# Patient Record
Sex: Female | Born: 1965 | Race: Asian | Hispanic: No | Marital: Married | Smoking: Never smoker
Health system: Southern US, Community
[De-identification: ages and names within clinical notes are randomized; demographics above are authoritative.]

## PROBLEM LIST (undated history)

## (undated) DIAGNOSIS — E785 Hyperlipidemia, unspecified: Secondary | ICD-10-CM

## (undated) DIAGNOSIS — I10 Essential (primary) hypertension: Secondary | ICD-10-CM

## (undated) DIAGNOSIS — D649 Anemia, unspecified: Secondary | ICD-10-CM

## (undated) HISTORY — DX: Essential (primary) hypertension: I10

## (undated) HISTORY — PX: NO PAST SURGERIES: SHX2092

## (undated) HISTORY — DX: Hyperlipidemia, unspecified: E78.5

## (undated) HISTORY — DX: Anemia, unspecified: D64.9

---

## 2006-01-19 ENCOUNTER — Other Ambulatory Visit: Admission: RE | Admit: 2006-01-19 | Discharge: 2006-01-19 | Payer: Self-pay | Admitting: Obstetrics and Gynecology

## 2006-01-25 ENCOUNTER — Ambulatory Visit: Payer: Self-pay | Admitting: Obstetrics and Gynecology

## 2006-03-09 ENCOUNTER — Ambulatory Visit: Payer: Self-pay | Admitting: Obstetrics and Gynecology

## 2006-03-11 ENCOUNTER — Ambulatory Visit (HOSPITAL_COMMUNITY): Admission: RE | Admit: 2006-03-11 | Discharge: 2006-03-11 | Payer: Self-pay | Admitting: Obstetrics and Gynecology

## 2006-06-20 ENCOUNTER — Encounter: Admission: RE | Admit: 2006-06-20 | Discharge: 2006-06-20 | Payer: Self-pay | Admitting: Family Medicine

## 2006-12-20 LAB — HM MAMMOGRAPHY: HM Mammogram: NORMAL

## 2011-02-10 ENCOUNTER — Other Ambulatory Visit (HOSPITAL_COMMUNITY)
Admission: RE | Admit: 2011-02-10 | Discharge: 2011-02-10 | Disposition: A | Discharge status: Home / Self Care | Payer: BC Managed Care – PPO | Source: Ambulatory Visit | Attending: Obstetrics and Gynecology | Admitting: Obstetrics and Gynecology

## 2011-02-10 ENCOUNTER — Other Ambulatory Visit: Payer: Self-pay | Admitting: Obstetrics and Gynecology

## 2011-02-10 DIAGNOSIS — Z01419 Encounter for gynecological examination (general) (routine) without abnormal findings: Secondary | ICD-10-CM | POA: Insufficient documentation

## 2011-03-03 ENCOUNTER — Other Ambulatory Visit: Payer: Self-pay | Admitting: Obstetrics and Gynecology

## 2011-05-07 NOTE — Group Therapy Note (Signed)
Amber [name], ATILANO NO.:  [id number]   MEDICAL RECORD NO.:  [id number]          PATIENT TYPE:  WOC   LOCATION:  [facility]                   FACILITY:  [facility]   PHYSICIAN:  Argentina Donovan, MD        DATE OF BIRTH:  01/04/1966   DATE OF SERVICE:                                    CLINIC NOTE   The patient is a 45 year old Asian female, with severe dysfunctional  bleeding, that was seen 1 month ago.  She started on oral contraceptives,  i.e., Yasmin, and the bleeding has gotten worse since that time.  It has now  abated.  I am encouraging her to restart the pill.  Meanwhile, we will get  and ultrasound to make sure she has nothing anatomical that we can identify.  If she continues to bleed heavily or begins bleeding heavily, we will  schedule a D&C.  If that does not work, we will go to an endometrial  ablation and if that does not work, the hysterectomy.  We have discussed  this with the patient.  She seems to understand this and that we do not want  to be too radical in her treatment, and she will call us if the bleeding  starts up again.           ______________________________  Argentina Donovan, MD     PR/MEDQ  D:  03/09/2006  T:  03/10/2006  Job:  [id number]

## 2011-05-07 NOTE — Group Therapy Note (Signed)
NAME:  Amber [name], Amber [name] NO.:  [id number]   MEDICAL RECORD NO.:  [id number]          PATIENT TYPE:  WOC   LOCATION:  [facility]                   FACILITY:  [facility]   PHYSICIAN:  Argentina Donovan, MD        DATE OF BIRTH:  April 01, 1966   DATE OF SERVICE:                                    CLINIC NOTE   HISTORY OF PRESENT ILLNESS:  The patient is a 45 year old gravida 1, para 1-  0-0-1 with a child 51 years old who is on metformin for type 2 diabetes, and  has had significantly heavy, irregular periods where she sometimes goes 3  months without a period, and bleeds for sometimes 10-15 days.  She has  marked acne as well as hair loss and mild hirsutism.  She shaves her face  and her abdomen.  I have discussed polycystic ovarian syndrome with the  patient.  She has had several ultrasounds which were normal in the past, and  we are going to start her on Yasmin as well as her continuing on the  metformin.  I have told her that after 2 months she should notice a decrease  in hair loss and improvement of her acne, and although the hair growth may  stop, the hair that she has will not go away without electrolysis or  shaving, etc.   IMPRESSION:  This patient has obvious polycystic ovarian syndrome, and is  going to be treated appropriately.           ______________________________  Argentina Donovan, MD     PR/MEDQ  D:  01/25/2006  T:  01/25/2006  Job:  [id number]

## 2011-06-07 ENCOUNTER — Ambulatory Visit (INDEPENDENT_AMBULATORY_CARE_PROVIDER_SITE_OTHER): Payer: BC Managed Care – PPO | Admitting: Internal Medicine

## 2011-06-07 ENCOUNTER — Encounter: Payer: Self-pay | Admitting: Internal Medicine

## 2011-06-07 DIAGNOSIS — I10 Essential (primary) hypertension: Secondary | ICD-10-CM

## 2011-06-07 DIAGNOSIS — N949 Unspecified condition associated with female genital organs and menstrual cycle: Secondary | ICD-10-CM

## 2011-06-07 DIAGNOSIS — E282 Polycystic ovarian syndrome: Secondary | ICD-10-CM

## 2011-06-07 DIAGNOSIS — R110 Nausea: Secondary | ICD-10-CM

## 2011-06-07 DIAGNOSIS — N938 Other specified abnormal uterine and vaginal bleeding: Secondary | ICD-10-CM

## 2011-06-07 DIAGNOSIS — N925 Other specified irregular menstruation: Secondary | ICD-10-CM

## 2011-06-07 DIAGNOSIS — E119 Type 2 diabetes mellitus without complications: Secondary | ICD-10-CM

## 2011-06-07 DIAGNOSIS — E785 Hyperlipidemia, unspecified: Secondary | ICD-10-CM

## 2011-06-07 LAB — HEPATIC FUNCTION PANEL
ALT: 67 U/L — ABNORMAL HIGH (ref 0–35)
AST: 63 U/L — ABNORMAL HIGH (ref 0–37)
Albumin: 4.1 g/dL (ref 3.5–5.2)
Alkaline Phosphatase: 59 U/L (ref 39–117)
Total Protein: 7.2 g/dL (ref 6.0–8.3)

## 2011-06-07 LAB — AMYLASE: Amylase: 94 U/L (ref 27–131)

## 2011-06-07 LAB — BASIC METABOLIC PANEL
CO2: 28 mEq/L (ref 19–32)
Chloride: 97 mEq/L (ref 96–112)
Creatinine, Ser: 0.7 mg/dL (ref 0.4–1.2)
GFR: 104.61 mL/min (ref >60.00)
Potassium: 3.5 mEq/L (ref 3.5–5.1)

## 2011-06-07 LAB — CBC WITH DIFFERENTIAL/PLATELET
Basophils Relative: 0.5 % (ref 0.0–3.0)
Eosinophils Relative: 1.4 % (ref 0.0–5.0)
MCHC: 32.7 g/dL (ref 30.0–36.0)
Neutro Abs: 4.2 10*3/uL (ref 1.4–7.7)
Neutrophils Relative %: 59.2 % (ref 43.0–77.0)
Platelets: 239 10*3/uL (ref 150.0–400.0)
RBC: 4.27 Mil/uL (ref 3.87–5.11)
RDW: 20 % — ABNORMAL HIGH (ref 11.5–14.6)

## 2011-06-07 LAB — LIPID PANEL: VLDL: 37 mg/dL (ref 0.0–40.0)

## 2011-06-07 MED ORDER — METFORMIN HCL ER 750 MG PO TB24: 750.0000 mg | ORAL_TABLET | Freq: Every day | ORAL | Status: DC

## 2011-06-07 MED ORDER — LISINOPRIL-HYDROCHLOROTHIAZIDE 20-25 MG PO TABS: 1.0000 | ORAL_TABLET | Freq: Every day | ORAL | Status: DC

## 2011-06-07 NOTE — Assessment & Plan Note (Signed)
Obtain LFT, amylase, and lipase. Discussed possible metformin side effect and pt indicates sx's mild.

## 2011-06-07 NOTE — Assessment & Plan Note (Signed)
Stable. Refill metformin at current dose. Obtain CBC, chem7, A1c, and urine microalbumin. Recommend scheduling yearly diabetic eye exam and pt states she will schedule herself

## 2011-06-07 NOTE — Assessment & Plan Note (Signed)
Stable. Continue ace/htctz. Obtain chem7.

## 2011-06-07 NOTE — Progress Notes (Signed)
  Subjective:    Patient ID: Amber [name], female    DOB: 02-19-1966, 44 y.o.   MRN: [id number]  HPI Pt presents to clinic to establish care and for follow up of diabetes mellitus. Notes fasting blood sugars ~120 without hypoglycemia and post prandial values of 160. Tolerates metformin though does c/o chronic intermittent nausea without abdominal pain, GERD sx's or emesis. Nausea is often after eating. Denies having had lab work for ~ 1 year. HTN had been well controlled with ace/diuretic combination but notes past hypokalemia. Was changed to aldactone but stopped the medication after significant difficulty with her menstration. Sees gyn for paps/mammograms and is reportedly up to date. Reportedly felt to have PCOS as well. Last tetanus reportedly 2003. No other complaints.  Reviewed pmh, psh, medications, allergies, soc hx and fam hx.    Review of Systems  Gastrointestinal: Positive for nausea. Negative for abdominal pain.  All other systems reviewed and are negative.       Objective:   Physical Exam  Nursing note and vitals reviewed. Constitutional: She appears well-developed and well-nourished. No distress.  HENT:  Head: Normocephalic and atraumatic.  Right Ear: Tympanic membrane, external ear and ear canal normal.  Left Ear: Tympanic membrane, external ear and ear canal normal.  Nose: Nose normal.  Mouth/Throat: Oropharynx is clear and moist. No oropharyngeal exudate.  Eyes: Conjunctivae are normal. Pupils are equal, round, and reactive to light. Right eye exhibits no discharge. Left eye exhibits no discharge. No scleral icterus.  Neck: Neck supple.  Cardiovascular: Normal rate, regular rhythm and normal heart sounds.  Exam reveals no gallop and no friction rub.   No murmur heard. Pulmonary/Chest: Effort normal and breath sounds normal. No respiratory distress. She has no wheezes. She has no rales.  Abdominal: Soft. Bowel sounds are normal. She exhibits no distension and no mass. There  is no tenderness. There is no rebound.  Lymphadenopathy:    She has no cervical adenopathy.  Neurological: She is alert.  Skin: Skin is warm and dry. No rash noted. She is not diaphoretic. No erythema.       Diabetic foot exam: no wound,ulceration or significant callousing. Monofilament negative  Psychiatric: She has a normal mood and affect.          Assessment & Plan:

## 2011-06-07 NOTE — Assessment & Plan Note (Signed)
Obtain flp. LDL goal <100 secondary to DM

## 2011-06-08 LAB — LDL CHOLESTEROL, DIRECT: Direct LDL: 117.7 mg/dL

## 2011-06-14 ENCOUNTER — Ambulatory Visit (INDEPENDENT_AMBULATORY_CARE_PROVIDER_SITE_OTHER): Payer: BC Managed Care – PPO | Admitting: Endocrinology

## 2011-06-14 ENCOUNTER — Encounter: Payer: Self-pay | Admitting: Endocrinology

## 2011-06-14 ENCOUNTER — Other Ambulatory Visit: Payer: Self-pay | Admitting: Internal Medicine

## 2011-06-14 DIAGNOSIS — E119 Type 2 diabetes mellitus without complications: Secondary | ICD-10-CM

## 2011-06-14 DIAGNOSIS — D649 Anemia, unspecified: Secondary | ICD-10-CM

## 2011-06-14 DIAGNOSIS — K760 Fatty (change of) liver, not elsewhere classified: Secondary | ICD-10-CM

## 2011-06-14 DIAGNOSIS — R748 Abnormal levels of other serum enzymes: Secondary | ICD-10-CM

## 2011-06-14 DIAGNOSIS — K7689 Other specified diseases of liver: Secondary | ICD-10-CM

## 2011-06-14 NOTE — Patient Instructions (Addendum)
good diet and exercise habits significanly improve the control of your diabetes.  please let me know if you wish to be referred to a dietician.  high blood sugar is very risky to your health.  you should see an eye doctor every year. controlling your blood pressure and cholesterol drastically reduces the damage diabetes does to your body.  this also applies to quitting smoking.  please discuss these with your doctor.  you should take an aspirin every day, unless you have been advised by a doctor not to. check your blood sugar 1 time a day.  vary the time of day when you check, between before the 3 meals, and at bedtime.  also check if you have symptoms of your blood sugar being too high or too low.  please keep a record of the readings and bring it to your next appointment here.  please call us sooner if you are having low blood sugar episodes.  For now, add actos 30 mg daily.  Please make a follow-up appointment in 6 weeks.

## 2011-06-14 NOTE — Progress Notes (Signed)
Subjective:    Patient ID: Amber [name], female    DOB: 02-Nov-1966, 45 y.o.   MRN: [id number]  HPI pt states 10 years h/o dm.  She is unaware of any chronic complications.  She has never been on insulin.  she takes metformin as rx'ed.  She says cbg's are low-100's.  She says she has not used birth control x 5 years, and finds she does not seem to need it.   pt says her diet and exercise are "good."  pt states few years of intermittent palpitations in the chest, but no assoc chest pain.  sxs are much better since she has been on metformin. Past Medical History  Diagnosis Date  . Diabetes mellitus   . Hyperlipidemia   . Hypertension     No past surgical history on file.  History   Social History  . Marital Status: Married    Spouse Name: N/A    Number of Children: 1  . Years of Education: BS   Occupational History  . Acupuncturist    Social History Main Topics  . Smoking status: Never Smoker   . Smokeless tobacco: Not on file  . Alcohol Use: No  . Drug Use: No  . Sexually Active: Not on file   Other Topics Concern  . Not on file   Social History Narrative  . No narrative on file    Current Outpatient Prescriptions on File Prior to Visit  Medication Sig Dispense Refill  . lisinopril-hydrochlorothiazide (PRINZIDE,ZESTORETIC) 20-25 MG per tablet Take 1 tablet by mouth daily.  90 tablet  4  . metFORMIN (GLUCOPHAGE-XR) 750 MG 24 hr tablet Take 1 tablet (750 mg total) by mouth daily with breakfast. Take 2 tabs daily   60 tablet  11  . CAMILA 0.35 MG tablet         No Known Allergies  Family History  Problem Relation Age of Onset  . Hypertension Mother   . Diabetes Father   . Diabetes Other     Grandparent  . Stroke Other     Grandparent  dtr also has dm, from age 42.  BP 112/82  Pulse 82  Temp(Src) 99.3 F (37.4 C) (Oral)  Ht 5\' 2"  (1.575 m)  Wt 156 lb (70.761 kg)  BMI 28.53 kg/m2  SpO2 98%  LMP 02/18/2011  Review of Systems denies weight loss, blurry  vision, headache, sob, n/v, urinary frequency, cramps, excessive diaphoresis, depression, hypoglycemia, rhinorrhea, and easy bruising.  She has intermittent difficulty with concentration.      Objective:   Physical Exam VS: see vs page GEN: no distress HEAD: head: no deformity eyes: no periorbital swelling, no proptosis external nose and ears are normal mouth: no lesion seen NECK: supple, thyroid is not enlarged CHEST WALL: no deformity CV: reg rate and rhythm, no murmur ABD: abdomen is soft, nontender.  no hepatosplenomegaly.  not distended.  no hernia MUSCULOSKELETAL: muscle bulk and strength are grossly normal.  no obvious joint swelling.  gait is normal and steady EXTEMITIES: no deformity.  no ulcer on the feet.  feet are of normal color and temp.  no edema PULSES: dorsalis pedis intact bilat.  no carotid bruit NEURO:  cn 2-12 grossly intact.   readily moves all 4's.  sensation is intact to touch on the feet SKIN:  Normal texture and temperature.  No rash or suspicious lesion is visible.  There is mild hirsutism on the chin, and on the abdomen NODES:  None palpable at the neck  PSYCH: alert, oriented x3.  Does not appear anxious nor depressed.    Lab Results  Component Value Date   HGBA1C 7.2* 06/07/2011   Lab Results  Component Value Date   ALT 67* 06/07/2011   AST 63* 06/07/2011   ALKPHOS 59 06/07/2011   BILITOT 0.5 06/07/2011    Assessment & Plan:  Dm, needs increased rx Elita Boone, needs increased rx Palpitations, uncertain etiology.  improved

## 2011-06-15 DIAGNOSIS — K760 Fatty (change of) liver, not elsewhere classified: Secondary | ICD-10-CM | POA: Insufficient documentation

## 2011-06-17 ENCOUNTER — Telehealth: Payer: Self-pay

## 2011-06-17 NOTE — Telephone Encounter (Signed)
Message copied by Beverely Low on Thu Jun 17, 2011 10:15 AM ------      Message from: Staci Righter      Created: Mon Jun 14, 2011  9:48 PM       Notify mildly anemic and needs further eval. Provide hemoccult cards x 3.       Chol mildly elevated. Low fat diet and exercise. Hold off on chol medication for now as liver tests mildly elevated. Return to lab for anemia and liver workup. Looks like endocrine is now seeing pt for dm.

## 2011-06-17 NOTE — Telephone Encounter (Signed)
Left detailed message on personally identified voicemail to notify pt of lab results. Labs and hemoccult cards mailed

## 2011-08-16 ENCOUNTER — Ambulatory Visit: Payer: BC Managed Care – PPO | Admitting: Endocrinology

## 2011-08-21 HISTORY — PX: OTHER SURGICAL HISTORY: SHX169

## 2011-09-12 ENCOUNTER — Encounter (HOSPITAL_COMMUNITY): Payer: Self-pay | Admitting: *Deleted

## 2011-09-12 ENCOUNTER — Observation Stay (HOSPITAL_COMMUNITY)
Admission: AD | Admit: 2011-09-12 | Discharge: 2011-09-13 | Disposition: A | Discharge status: Home / Self Care | Payer: BC Managed Care – PPO | Source: Ambulatory Visit | Attending: Obstetrics and Gynecology | Admitting: Obstetrics and Gynecology

## 2011-09-12 ENCOUNTER — Encounter (HOSPITAL_COMMUNITY): Payer: Self-pay | Admitting: Anesthesiology

## 2011-09-12 ENCOUNTER — Inpatient Hospital Stay (HOSPITAL_COMMUNITY): Payer: BC Managed Care – PPO

## 2011-09-12 DIAGNOSIS — D500 Iron deficiency anemia secondary to blood loss (chronic): Secondary | ICD-10-CM | POA: Insufficient documentation

## 2011-09-12 DIAGNOSIS — R102 Pelvic and perineal pain: Secondary | ICD-10-CM

## 2011-09-12 DIAGNOSIS — N920 Excessive and frequent menstruation with regular cycle: Principal | ICD-10-CM | POA: Insufficient documentation

## 2011-09-12 LAB — SAMPLE TO BLOOD BANK

## 2011-09-12 LAB — CBC
HCT: 17.5 % — ABNORMAL LOW (ref 36.0–46.0)
Hemoglobin: 5.6 g/dL — CL (ref 12.0–15.0)
MCH: 27.2 pg (ref 26.0–34.0)
MCHC: 32 g/dL (ref 30.0–36.0)
MCV: 85 fL (ref 78.0–100.0)
Platelets: 276 10*3/uL (ref 150–400)
RBC: 2.06 MIL/uL — ABNORMAL LOW (ref 3.87–5.11)
RDW: 18.2 % — ABNORMAL HIGH (ref 11.5–15.5)
WBC: 11.1 10*3/uL — ABNORMAL HIGH (ref 4.0–10.5)

## 2011-09-12 LAB — HCG, QUANTITATIVE, PREGNANCY: hCG, Beta Chain, Quant, S: <1 m[IU]/mL (ref <5)

## 2011-09-12 LAB — COMPREHENSIVE METABOLIC PANEL
ALT: 58 U/L — ABNORMAL HIGH (ref 0–35)
Albumin: 2.9 g/dL — ABNORMAL LOW (ref 3.5–5.2)
Alkaline Phosphatase: 49 U/L (ref 39–117)
Calcium: 8.9 mg/dL (ref 8.4–10.5)
GFR calc Af Amer: >60 mL/min (ref >60)
Glucose, Bld: 237 mg/dL — ABNORMAL HIGH (ref 70–99)
Potassium: 3.1 mEq/L — ABNORMAL LOW (ref 3.5–5.1)
Sodium: 133 mEq/L — ABNORMAL LOW (ref 135–145)
Total Protein: 5.6 g/dL — ABNORMAL LOW (ref 6.0–8.3)

## 2011-09-12 LAB — PROTIME-INR
INR: 1.03 (ref 0.00–1.49)
Prothrombin Time: 13.7 seconds (ref 11.6–15.2)

## 2011-09-12 LAB — GLUCOSE, CAPILLARY: Glucose-Capillary: 145 mg/dL — ABNORMAL HIGH (ref 70–99)

## 2011-09-12 LAB — FIBRINOGEN: Fibrinogen: 281 mg/dL (ref 204–475)

## 2011-09-12 LAB — APTT: aPTT: 23 seconds — ABNORMAL LOW (ref 24–37)

## 2011-09-12 LAB — ABO/RH: ABO/RH(D): O POS

## 2011-09-12 LAB — PREPARE RBC (CROSSMATCH)

## 2011-09-12 MED ORDER — SODIUM CHLORIDE 0.9 % IV SOLN
Freq: Once | INTRAVENOUS | Status: AC
  Administered 2011-09-12: 18:00:00 via INTRAVENOUS

## 2011-09-12 MED ORDER — ESTROGENS CONJUGATED 25 MG IJ SOLR
25.0000 mg | Freq: Once | INTRAMUSCULAR | Status: AC
  Administered 2011-09-12: 25 mg via INTRAVENOUS
  Filled 2011-09-12: qty 25

## 2011-09-12 MED ORDER — SODIUM CHLORIDE 0.9 % IV SOLN
INTRAVENOUS | Status: DC
  Administered 2011-09-13: 06:00:00 via INTRAVENOUS

## 2011-09-12 NOTE — Progress Notes (Signed)
Had ultrasound and bloodwork done on Friday hemoglobin was low around 6 and ultrasound was negative.

## 2011-09-12 NOTE — Progress Notes (Signed)
Notified Dr. Henderson Cloud of  U/S resutls. Order to transfer pt to 3rd floor to receive her units of blood and then go to OR.

## 2011-09-12 NOTE — ED Notes (Signed)
CRITICAL VALUE ALERT  Critical value received: hemaglobin 5.6  Date of notification:  09/12/2011 Time of notification: 1540  Critical value read back:yes  Nurse who received alert:  K.Naina Sleeper,RN   MD notified (1st page):  K.Harris,NP  Time of first page:  1545  MD notified (2nd page):  Time of second page:  Responding MD:    Time MD responded:

## 2011-09-12 NOTE — Progress Notes (Signed)
IV started by anesthsia after 3 more attempts

## 2011-09-12 NOTE — Anesthesia Preprocedure Evaluation (Deleted)
Anesthesia Evaluation  Name, MR# and DOB Patient awake  General Assessment Comment  Reviewed: Allergy & Precautions, H&P , NPO status   Airway Mallampati: III TM Distance: >3 FB Neck ROM: Full    Dental No notable dental hx. (+) Teeth Intact   Pulmonary  clear to auscultation  pulmonary exam normalPulmonary Exam Normal breath sounds clear to auscultation none    Cardiovascular hypertension, Pt. on medications Regular Normal+ Systolic murmurs and + Peripheral Edema II/VI SEM @RUSB  & LLSBII/VI SEM @RUSB  & LLSB:     Neuro/Psych Negative Neurological ROS  Negative Psych ROS  GI/Hepatic/Renal negative GI ROS  negative Liver ROS  negative Renal ROS        Endo/Other  Negative Endocrine ROS (+) Diabetes mellitus-, Well Controlled, Type 2, Oral Hypoglycemic Agents,     Abdominal Normal abdominal exam  (+) obese,   Musculoskeletal negative musculoskeletal ROS (+)   Hematology negative hematology ROS (+)   Peds  Reproductive/Obstetrics negative OB ROS    Anesthesia Other Findings             Anesthesia Physical Anesthesia Plan  ASA: III and Emergent  Anesthesia Plan: MAC and General   Post-op Pain Management:    Induction: Intravenous  Airway Management Planned: Oral ETT and Natural Airway  Additional Equipment:   Intra-op Plan:   Post-operative Plan:   Informed Consent:   Dental advisory given  Plan Discussed with: Anesthesiologist, CRNA and Surgeon  Anesthesia Plan Comments: (Patient will receive 2-3 units of PRBCs prior to OR. At present she is symptomatic and orthostatic from her anemia, every time she stands up she states she feels like she is going to "pass out". She ate a peanut butter and jelly sandwich at 1600, so she will be considered a full stomach until at least 12mn. Plan is to give PRBCs prior to OR unless her bleeding gets worse. Check coagulation profile. I have discussed both GA  with ETT as well as MAC with the patient.)       Anesthesia Quick Evaluation

## 2011-09-12 NOTE — Progress Notes (Signed)
Bleeding for 5 weeks heavy at times, passing clots, feels dizziness, heart racing.

## 2011-09-12 NOTE — ED Provider Notes (Signed)
History     CSN: [id number] Arrival date & time: 09/12/2011  2:14 PM  Chief Complaint  Patient presents with  . Vaginal Bleeding  . Dizziness    HPI  (Consider location/radiation/quality/duration/timing/severity/associated sxs/prior treatment)  HPINing Amber [name] is a 45 y.o. G1P1 who presents for vaginal bleeding. Pt has been bleeding x 5 wks, worse x 2 wks. Changes a diaper type pad 2x/hr,passing egg size clots. Pt has been on ? Progestin only OC's BID, on 9/21 was changed to Lo Estrin Fe. Pt states her bleeding has increased since that time. She saw Dr Amber Amber [name] in the office Friday, U/S was neg. Pt feeling dizzy, feels like her heart is going fast.  Past Medical History  Diagnosis Date  . Diabetes mellitus   . Hyperlipidemia   . Hypertension     Past Surgical History  Procedure Date  . No past surgeries     Family History  Problem Relation Age of Onset  . Hypertension Mother   . Diabetes Father   . Diabetes Other     Grandparent  . Stroke Other     Grandparent    History  Substance Use Topics  . Smoking status: Never Smoker   . Smokeless tobacco: Not on file  . Alcohol Use: No    OB History    Grav Para Term Preterm Abortions TAB SAB Ect Mult Living   1 1 1  0 0 0 0 0 0 1      Review of Systems  Review of Systems  Cardiovascular: Positive for palpitations.  Genitourinary: Positive for vaginal bleeding.  Neurological: Positive for dizziness.    Allergies  Review of patient's allergies indicates no known allergies.  Home Medications  No current outpatient prescriptions on file.  Physical Exam    BP 100/56  Pulse 110  Temp(Src) 99.1 F (37.3 C) (Oral)  Resp 18  SpO2 100%  Physical Exam  Nursing note and vitals reviewed. Constitutional: She appears well-developed and well-nourished.  Abdominal: Soft. There is no tenderness.  Genitourinary:       Ext gen:- nl for age Vagina- large amt thin red blood , occ 3-4 cm clots. Cleared with 5 swabs. Cx- parous,  continuous dribble of blood  Uterus-sl enlarged Adn- non tender, no masses    ED Course  Procedures (including critical care time)   Labs Reviewed  CBC  SAMPLE TO BLOOD BANK  Assesssment- Pt is not orthostatic, but Hgb 5.6/Hct 17.5, Dr Amber Amber [name] notified. Orders received, he will come see the pt.   No diagnosis found.   MDM         Amber Amber [name]. Amber Amber [name] 09/12/11 1648

## 2011-09-12 NOTE — H&P (Signed)
Amber [name] is an 45 y.o. female. G1P1 with a history of heavy/irregular bleeding.  This episode for about 5 weeks, worse for 2 weeks.  On OCP 1 qd for 2 weeks.  On Friday in office hgb=6 and started on OCP bid.  For last several days, cannot stand without extreme dizziness. No syncope.  Pertinent Gynecological History: Menses: flow is excessive with use of many pads or tampons on heaviest days Bleeding: intermenstrual bleeding Contraception: none DES exposure: unknown Blood transfusions: none Sexually transmitted diseases: no past history Previous GYN Procedures: none  Last mammogram: unknown Date: unknown Last pap: normal Date: unknown OB History: G1, P1   Menstrual History: Menarche age: unknown No LMP recorded.    Past Medical History  Diagnosis Date  . Diabetes mellitus   . Hyperlipidemia   . Hypertension     Past Surgical History  Procedure Date  . No past surgeries     Family History  Problem Relation Age of Onset  . Hypertension Mother   . Diabetes Father   . Diabetes Other     Grandparent  . Stroke Other     Grandparent    Social History:  reports that she has never smoked. She does not have any smokeless tobacco history on file. She reports that she does not drink alcohol or use illicit drugs.  Allergies: No Known Allergies  Prescriptions prior to admission  Medication Sig Dispense Refill  . IRON PO Take 1 tablet by mouth 3 (three) times daily after meals.        Marland Kitchen lisinopril-hydrochlorothiazide (PRINZIDE,ZESTORETIC) 20-25 MG per tablet Take 1 tablet by mouth daily.        . metFORMIN (GLUCOPHAGE-XR) 750 MG 24 hr tablet Take 1,500 mg by mouth daily with breakfast. Take 2 tabs daily        . Norethin-Eth Estrad-Fe Biphas (LO LOESTRIN FE PO) Take 1 tablet by mouth 2 (two) times daily.        Marland Kitchen DISCONTD: lisinopril-hydrochlorothiazide (PRINZIDE,ZESTORETIC) 20-25 MG per tablet Take 1 tablet by mouth daily.  90 tablet  4  . DISCONTD: metFORMIN (GLUCOPHAGE-XR) 750  MG 24 hr tablet Take 1 tablet (750 mg total) by mouth daily with breakfast. Take 2 tabs daily   60 tablet  11  . CAMILA 0.35 MG tablet Take 1 tablet by mouth 2 (two) times daily.       . pioglitazone (ACTOS) 30 MG tablet Take 30 mg by mouth daily.          Review of Systems  Constitutional: Positive for malaise/fatigue.  Neurological: Positive for dizziness and weakness.    Blood pressure 100/56, pulse 110, temperature 99.1 F (37.3 C), temperature source Oral, resp. rate 18, SpO2 100.00%. Physical Exam  Constitutional: She is oriented to person, place, and time. She appears well-developed and well-nourished.  Cardiovascular: Regular rhythm and normal heart sounds.   Respiratory: Effort normal and breath sounds normal.  GI: Soft. Bowel sounds are normal. There is no tenderness.  Neurological: She is alert and oriented to person, place, and time.  Skin: There is pallor.    Results for orders placed during the hospital encounter of 09/12/11 (from the past 24 hour(s))  CBC     Status: Abnormal   Collection Time   09/12/11  3:11 PM      Component Value Range   WBC 11.1 (*) 4.0 - 10.5 (K/uL)   RBC 2.06 (*) 3.87 - 5.11 (MIL/uL)   Hemoglobin 5.6 (*) 12.0 - 15.0 (g/dL)  HCT 17.5 (*) 36.0 - 46.0 (%)   MCV 85.0  78.0 - 100.0 (fL)   MCH 27.2  26.0 - 34.0 (pg)   MCHC 32.0  30.0 - 36.0 (g/dL)   RDW 16.1 (*) 09.6 - 15.5 (%)   Platelets 276  150 - 400 (K/uL)  SAMPLE TO BLOOD BANK     Status: Normal   Collection Time   09/12/11  3:11 PM      Component Value Range   Blood Bank Specimen SAMPLE AVAILABLE FOR TESTING     Sample Expiration 09/15/2011    TYPE AND SCREEN     Status: Normal   Collection Time   09/12/11  3:11 PM      Component Value Range   ABO/RH(D) O POS     Antibody Screen NEG     Sample Expiration 09/15/2011    PREPARE RBC (CROSSMATCH)     Status: Normal   Collection Time   09/12/11  5:00 PM      Component Value Range   Order Confirmation ORDER PROCESSED BY BLOOD BANK        No results found.  Assessment/Plan: 45 yo MAF G1P1 with menometrorrhagia and symptomatic anemia. IV now started. D/W Dr Malen Gauze anesthesiologist. Will hydrate with IV fluids, transfuse 2 units PRBCs, pelvic U/S, check CMET/coags/QHCG. After above will reevaluate bleeding and pt status. D/W pt and her family probable D&C with risks of infection, uterine perforation/organ damage, bleeding/transfusion, DVT/PE, pneumonia. D/W poss IV estrogen with risks DVT/PE, N/V. D/W transfusion and risks-HIV/Hep, transfusion reaction.  Annel Zunker II,Annalia Metzger E 09/12/2011, 5:36 PM

## 2011-09-12 NOTE — Progress Notes (Signed)
IV attempted by 2 RN's. CNM tryed to start but unable. CNRNA called to start IV for surgery.

## 2011-09-13 ENCOUNTER — Encounter (HOSPITAL_COMMUNITY)
Admission: AD | Disposition: A | Discharge status: Home / Self Care | Payer: Self-pay | Source: Ambulatory Visit | Attending: Obstetrics and Gynecology

## 2011-09-13 LAB — TYPE AND SCREEN
Antibody Screen: NEGATIVE
Unit division: 0

## 2011-09-13 NOTE — Discharge Summary (Signed)
Physician Discharge Summary  Patient ID: Amber [name] MRN: [id number] DOB/AGE: 01/03/1966 45 y.o.  Admit date: 09/12/2011 Discharge date: 09/13/2011  Admission Diagnoses:menorraghia and anemia  Discharge Diagnoses:  Active Problems:  * No active hospital problems. *    Discharged Condition: stable  Hospital Course:  Patient was admitted for menorraghia and anemia. Ultrasound revealed thin endometrium, without evidence of polyp or clot. Patient was transfused 2 units and follow up Hgb. Was 8 mg/dl. She denied any dizziness or  SOB. Pulse 78.  Consults: none  Significant Diagnostic Studies: labs: CBC, Coags  Treatments: IV hydration and transfusion  Discharge Exam: Blood pressure 110/69, pulse 78, temperature 98 F (36.7 C), temperature source Oral, resp. rate 18, SpO2 100.00%. General appearance: alert, cooperative and no distress  Disposition: Final discharge disposition not confirmed  Discharge Orders    Future Appointments: Provider: Department: Dept Phone: Center:   10/08/2011 8:45 AM Ala Dach [name]. [facility] [phone] [facility]     Current Discharge Medication List    CONTINUE these medications which have NOT CHANGED   Details  IRON PO Take 1 tablet by mouth 3 (three) times daily after meals.      lisinopril-hydrochlorothiazide (PRINZIDE,ZESTORETIC) 20-25 MG per tablet Take 1 tablet by mouth daily.      metFORMIN (GLUCOPHAGE-XR) 750 MG 24 hr tablet Take 1,500 mg by mouth daily with breakfast. Take 2 tabs daily      Norethin-Eth Estrad-Fe Biphas (LO LOESTRIN FE PO) Take 1 tablet by mouth 2 (two) times daily.      pioglitazone (ACTOS) 30 MG tablet Take 30 mg by mouth daily.        STOP taking these medications     CAMILA 0.35 MG tablet        Follow-up Information    Follow up with [facility] in 1 week. (calll for increase in bleeding , dizziness or SOB)    Contact information:   [address] [place]  [address] [phone]          Signed: Judith Blonder 09/13/2011, 9:29 AM

## 2011-09-13 NOTE — Progress Notes (Signed)
Feeling much better  VSS P 80s reg, Afeb Good color Lungs CTA  Hgb=8.0  A/P: observe until later this am, then probable D/C

## 2011-09-13 NOTE — Progress Notes (Signed)
Subjective: Patient reports no problems voiding. Bleeding scant. Denies dizziness SOB   Objective: I have reviewed patient's vital signs, labs and radiology results.  General: alert, cooperative and no distress ABD soft, Scant bleeding on pad   Assessment/Plan: Menorraghia, s/p transfusion  LOS: 1 day    Rakan Soffer G 09/13/2011, 9:16 AM

## 2011-09-16 NOTE — Progress Notes (Signed)
UR Chart review completed.  

## 2011-09-20 HISTORY — PX: DILATION AND CURETTAGE OF UTERUS: SHX78

## 2011-09-20 HISTORY — PX: OTHER SURGICAL HISTORY: SHX169

## 2011-10-08 ENCOUNTER — Ambulatory Visit: Payer: BC Managed Care – PPO | Admitting: Internal Medicine

## 2011-10-08 DIAGNOSIS — Z0289 Encounter for other administrative examinations: Secondary | ICD-10-CM

## 2011-10-14 ENCOUNTER — Other Ambulatory Visit: Payer: Self-pay | Admitting: Obstetrics and Gynecology

## 2012-02-25 ENCOUNTER — Ambulatory Visit (INDEPENDENT_AMBULATORY_CARE_PROVIDER_SITE_OTHER): Payer: BC Managed Care – PPO | Admitting: Internal Medicine

## 2012-02-25 ENCOUNTER — Other Ambulatory Visit: Payer: Self-pay | Admitting: Internal Medicine

## 2012-02-25 ENCOUNTER — Encounter: Payer: Self-pay | Admitting: Internal Medicine

## 2012-02-25 VITALS — BP 120/80 | HR 78 | Ht 63.0 in | Wt 152.0 lb

## 2012-02-25 DIAGNOSIS — E119 Type 2 diabetes mellitus without complications: Secondary | ICD-10-CM

## 2012-02-25 DIAGNOSIS — K6289 Other specified diseases of anus and rectum: Secondary | ICD-10-CM

## 2012-02-25 DIAGNOSIS — E785 Hyperlipidemia, unspecified: Secondary | ICD-10-CM

## 2012-02-25 DIAGNOSIS — I10 Essential (primary) hypertension: Secondary | ICD-10-CM

## 2012-02-25 DIAGNOSIS — E876 Hypokalemia: Secondary | ICD-10-CM

## 2012-02-25 DIAGNOSIS — K7689 Other specified diseases of liver: Secondary | ICD-10-CM

## 2012-02-25 DIAGNOSIS — Z9289 Personal history of other medical treatment: Secondary | ICD-10-CM

## 2012-02-25 DIAGNOSIS — Z9189 Other specified personal risk factors, not elsewhere classified: Secondary | ICD-10-CM

## 2012-02-25 DIAGNOSIS — K760 Fatty (change of) liver, not elsewhere classified: Secondary | ICD-10-CM

## 2012-02-25 LAB — CBC WITH DIFFERENTIAL/PLATELET
Basophils Absolute: 0 10*3/uL (ref 0.0–0.1)
Basophils Relative: 0.5 % (ref 0.0–3.0)
Eosinophils Absolute: 0.1 10*3/uL (ref 0.0–0.7)
Eosinophils Relative: 1.1 % (ref 0.0–5.0)
HCT: 38 % (ref 36.0–46.0)
Hemoglobin: 12.6 g/dL (ref 12.0–15.0)
Lymphocytes Relative: 33.3 % (ref 12.0–46.0)
Lymphs Abs: 2.4 10*3/uL (ref 0.7–4.0)
MCHC: 33.2 g/dL (ref 30.0–36.0)
MCV: 85.6 fl (ref 78.0–100.0)
Monocytes Absolute: 0.3 10*3/uL (ref 0.1–1.0)
Monocytes Relative: 4.5 % (ref 3.0–12.0)
Neutro Abs: 4.3 10*3/uL (ref 1.4–7.7)
Neutrophils Relative %: 60.6 % (ref 43.0–77.0)
Platelets: 213 10*3/uL (ref 150.0–400.0)
RBC: 4.44 Mil/uL (ref 3.87–5.11)
RDW: 18.3 % — ABNORMAL HIGH (ref 11.5–14.6)
WBC: 7.1 10*3/uL (ref 4.5–10.5)

## 2012-02-25 LAB — TSH: TSH: 2.2 u[IU]/mL (ref 0.35–5.50)

## 2012-02-25 LAB — BASIC METABOLIC PANEL WITH GFR
BUN: 10 mg/dL (ref 6–23)
CO2: 23 meq/L (ref 19–32)
Calcium: 9.2 mg/dL (ref 8.4–10.5)
Chloride: 101 meq/L (ref 96–112)
Creatinine, Ser: 0.8 mg/dL (ref 0.4–1.2)
GFR: 85.76 mL/min
Glucose, Bld: 149 mg/dL — ABNORMAL HIGH (ref 70–99)
Potassium: 3 meq/L — ABNORMAL LOW (ref 3.5–5.1)
Sodium: 137 meq/L (ref 135–145)

## 2012-02-25 LAB — HEPATIC FUNCTION PANEL
AST: 79 U/L — ABNORMAL HIGH (ref 0–37)
Albumin: 4 g/dL (ref 3.5–5.2)
Alkaline Phosphatase: 60 U/L (ref 39–117)
Total Protein: 7.3 g/dL (ref 6.0–8.3)

## 2012-02-25 LAB — MICROALBUMIN / CREATININE URINE RATIO
Creatinine,U: 54.6 mg/dL
Microalb Creat Ratio: 31.7 mg/g — ABNORMAL HIGH (ref 0.0–30.0)
Microalb, Ur: 17.3 mg/dL — ABNORMAL HIGH (ref 0.0–1.9)

## 2012-02-25 LAB — LIPID PANEL
Cholesterol: 185 mg/dL (ref 0–200)
HDL: 64.3 mg/dL
LDL Cholesterol: 95 mg/dL (ref 0–99)
Total CHOL/HDL Ratio: 3
Triglycerides: 131 mg/dL (ref 0.0–149.0)
VLDL: 26.2 mg/dL (ref 0.0–40.0)

## 2012-02-25 LAB — HEMOGLOBIN A1C: Hgb A1c MFr Bld: 7 % — ABNORMAL HIGH (ref 4.6–6.5)

## 2012-02-25 LAB — VITAMIN B12: Vitamin B-12: 356 pg/mL (ref 211–911)

## 2012-02-25 MED ORDER — LANCETS THIN MISC: Status: DC

## 2012-02-25 MED ORDER — GLUCOSE BLOOD VI STRP: ORAL_STRIP | Status: DC

## 2012-02-25 NOTE — Progress Notes (Signed)
Subjective:    Patient ID: Amber [name], female    DOB: February 09, 1966, 46 y.o.   MRN: [id number]  HPI Patient comes in as new patient visit . To this provider .  Previous care was via dr Amber [name] who is  At another site  In practice . Problems as stated: DM has seen Dr Amber [name]  Was given actose and not taking this for now  Vision exams no complication   noted no neuropathy reported or vascular disease, HT about 14 years.   Another diuretic acaused heavy period Had Dand c and ablation for heavy periods   DUB PCOS .  No hx of hepatitis.   Documented  Has hepatic steatosis per pt  New: Has had some rectal pain fullness that feels like needs to have bm  but no bleeding no NVD ;wakes her at night.     Review of Systems ROS:  GEN/ HEENT: No fever, significant weight changes  Hard to lose weight sweats headaches vision problems hearing changes, CV/ PULM; No chest pain shortness of breath cough, syncope,edema  change in exercise tolerance. GI /GU: No adominal pain, vomiting, change in bowel habits. No blood in the stool. No significant GU symptoms. SKIN/HEME: ,no acute skin rashes suspicious lesions or bleeding. No lymphadenopathy, nodules, masses.  NEURO/ PSYCH:  No neurologic signs such as weakness numbness. No depression anxiety. IMM/ Allergy: No unusual infections.  Allergy .   REST of 12 system review negative except as per HPI   Past history family history social history reviewed/ updated  in the electronic medical record.  Is an acupuncturist  Outpatient Prescriptions Prior to Visit  Medication Sig Dispense Refill  . lisinopril-hydrochlorothiazide (PRINZIDE,ZESTORETIC) 20-25 MG per tablet Take 1 tablet by mouth daily.        . metFORMIN (GLUCOPHAGE-XR) 750 MG 24 hr tablet Take 1,500 mg by mouth daily with breakfast. Take 2 tabs daily        . IRON PO Take 1 tablet by mouth 3 (three) times daily after meals.        Amber [name] Estrad-Fe Biphas (LO LOESTRIN FE PO) Take 1 tablet by mouth 2  (two) times daily.        . pioglitazone (ACTOS) 30 MG tablet Take 30 mg by mouth daily.     Not taking           Objective:   Physical Exam Physical Exam: Vital signs reviewed NWG:NFAO is a well-developed well-nourished alert cooperative  Asian A female who appears her stated age in no acute distress.  HEENT: normocephalic atraumatic , Eyes: PERRL EOM's full, conjunctiva clear, Nares: paten,t no deformity discharge or tenderness., Ears: no deformity EAC's clear TMs with normal landmarks. Mouth: clear OP, no lesions, edema.  Moist mucous membranes. Dentition in adequate repair. NECK: supple without masses, thyromegaly or bruits. CHEST/PULM:  Clear to auscultation and percussion breath sounds equal no wheeze , rales or rhonchi.  CV: PMI is nondisplaced, S1 S2 no gallops, murmurs, rubs. Peripheral pulses are full without delay.No JVD .  ABDOMEN: Bowel sounds normal nontender  No guard or rebound, no hepato splenomegal no CVA tenderness. Extremtities:  No clubbing cyanosis or edema, no acute joint swelling or redness no focal atrophy NEURO:  Oriented x3, cranial nerves 3-12 appear to be intact, no obvious focal weakness,gait within normal limits  SKIN: No acute rashes normal turgor, color, no bruising or petechiae. PSYCH: Oriented, good eye contact, no obvious depression anxiety, cognition and judgment appear normal. LN: no cervical  adenopathy   pt hx sheet reviewed .       Assessment & Plan:  DM  Uncertain control   No documented complications seen   Get labs today . would benefit from nutiritionis t some issues in the past wieh understanding what person is saying ? If language barrier or other. rc one on one  Hectic schedule works 6 day sper week . Changes  from white rice to brown but poss  Still an issue.  Would not use weight loss HCG shots   nas asked ,  Consider byetta or victoza meds if need add on therapy  She didn't take the actos for ? Reasons.  HT  controlled Hx of protein in  urine recheck today  Hx of hepatic  steatosis Rectal pain uncertain cause   Will do referral .  Hx of transfusion and anemia   Pt some concern about having to get transfusion will check hep status  Not hiv etc  DUB better  Preventive Health Care Thinks had varicella shot? Never chicken pox.  No response to hep b  Many other issues need to be addressed with her disease states but this is her first visit with this provider and will follow up. See endo if needed.    Labs  Back  Reviewed with patient  At daughters office visit.   Low K  Elevated lfts and  g a1c 7    Abn LFTS  Hep c not done different test may be added on . Will call in potassium supplement  20 meq qd and then recheck labs in 3-4 weeks LFTS and BMP  mg   In about 3-4 weeks then plan follow up.   Ok to get twinrix today. And do series   Lab Results  Component Value Date   WBC 7.1 02/25/2012   HGB 12.6 02/25/2012   HCT 38.0 02/25/2012   PLT 213.0 02/25/2012   GLUCOSE 149* 02/25/2012   CHOL 185 02/25/2012   TRIG 131.0 02/25/2012   HDL 64.30 02/25/2012   LDLDIRECT 117.7 06/07/2011   LDLCALC 95 02/25/2012   ALT 116* 02/25/2012   AST 79* 02/25/2012   NA 137 02/25/2012   K 3.0* 02/25/2012   CL 101 02/25/2012   CREATININE 0.8 02/25/2012   BUN 10 02/25/2012   CO2 23 02/25/2012   TSH 2.20 02/25/2012   INR 1.03 09/12/2011   HGBA1C 7.0* 02/25/2012   MICROALBUR 17.3* 02/25/2012   Total visit > 50% spent counseling and coordinating care  reviewing data and fu with patient

## 2012-02-25 NOTE — Patient Instructions (Addendum)
Will notify you  of labs when available. Then make plan for follow up and disease management. Someone will contact you about  Dietary nutrition referral  And GI referral . Avoid simple carbohydrates  Considering a food diary before you go to the  Nutritionist.   If approproate we may ask you to get the hepatitis A B vaccine and the chicken pox vaccine.

## 2012-02-26 LAB — HEPATITIS B SURFACE ANTIBODY,QUALITATIVE: Hep B S Ab: NEGATIVE

## 2012-02-26 LAB — HEPATITIS B SURFACE ANTIGEN: Hepatitis B Surface Ag: NEGATIVE

## 2012-02-26 LAB — HEPATITIS B CORE ANTIBODY, TOTAL: Hep B Core Total Ab: NEGATIVE

## 2012-02-28 LAB — VARICELLA ZOSTER ANTIBODY, IGG: Varicella IgG: 4.46 {ISR} — ABNORMAL HIGH

## 2012-03-01 ENCOUNTER — Ambulatory Visit (INDEPENDENT_AMBULATORY_CARE_PROVIDER_SITE_OTHER): Payer: BC Managed Care – PPO | Admitting: *Deleted

## 2012-03-01 DIAGNOSIS — Z23 Encounter for immunization: Secondary | ICD-10-CM

## 2012-03-01 MED ORDER — POTASSIUM CHLORIDE CRYS ER 20 MEQ PO TBCR: 20.0000 meq | EXTENDED_RELEASE_TABLET | Freq: Every day | ORAL | Status: DC

## 2012-03-02 LAB — HEPATITIS C ANTIBODY: HCV Ab: NEGATIVE

## 2012-03-05 ENCOUNTER — Encounter: Payer: Self-pay | Admitting: Internal Medicine

## 2012-03-07 NOTE — Progress Notes (Signed)
Quick Note:  Left a message for pt to return call. ______ 

## 2012-03-15 NOTE — Progress Notes (Signed)
Quick Note:    Results mailed to pt  ______

## 2012-03-23 ENCOUNTER — Encounter: Payer: Self-pay | Admitting: Internal Medicine

## 2012-03-23 ENCOUNTER — Ambulatory Visit (INDEPENDENT_AMBULATORY_CARE_PROVIDER_SITE_OTHER): Payer: BC Managed Care – PPO | Admitting: Internal Medicine

## 2012-03-23 VITALS — BP 140/92 | Temp 98.5°F | Wt 156.0 lb

## 2012-03-23 DIAGNOSIS — J069 Acute upper respiratory infection, unspecified: Secondary | ICD-10-CM

## 2012-03-23 DIAGNOSIS — I10 Essential (primary) hypertension: Secondary | ICD-10-CM

## 2012-03-23 DIAGNOSIS — J029 Acute pharyngitis, unspecified: Secondary | ICD-10-CM

## 2012-03-23 DIAGNOSIS — E119 Type 2 diabetes mellitus without complications: Secondary | ICD-10-CM

## 2012-03-23 LAB — POCT RAPID STREP A (OFFICE): Rapid Strep A Screen: NEGATIVE

## 2012-03-23 MED ORDER — HYDROCODONE-HOMATROPINE 5-1.5 MG/5ML PO SYRP: 5.0000 mL | ORAL_SOLUTION | Freq: Four times a day (QID) | ORAL | Status: AC | PRN

## 2012-03-23 NOTE — Progress Notes (Signed)
  Subjective:    Patient ID: Amber [name], female    DOB: Apr 10, 1966, 46 y.o.   MRN: [id number]  HPI  46 year old patient who presents with a 2 to three-day history of sore throat hoarseness some postnasal drip and mild cough. There's been no fever sore throat discomfort seems to radiate to both ears. She has a history of treated hypertension and diabetes.    Review of Systems  Constitutional: Negative.   HENT: Positive for sore throat, voice change and postnasal drip. Negative for hearing loss, congestion, rhinorrhea, dental problem, sinus pressure and tinnitus.   Eyes: Negative for pain, discharge and visual disturbance.  Respiratory: Positive for cough. Negative for shortness of breath.   Cardiovascular: Negative for chest pain, palpitations and leg swelling.  Gastrointestinal: Negative for nausea, vomiting, abdominal pain, diarrhea, constipation, blood in stool and abdominal distention.  Genitourinary: Negative for dysuria, urgency, frequency, hematuria, flank pain, vaginal bleeding, vaginal discharge, difficulty urinating, vaginal pain and pelvic pain.  Musculoskeletal: Negative for joint swelling, arthralgias and gait problem.  Skin: Negative for rash.  Neurological: Negative for dizziness, syncope, speech difficulty, weakness, numbness and headaches.  Hematological: Negative for adenopathy.  Psychiatric/Behavioral: Negative for behavioral problems, dysphoric mood and agitation. The patient is not nervous/anxious.        Objective:   Physical Exam  Constitutional: She is oriented to person, place, and time. She appears well-developed and well-nourished.       Extremely  hoarse ; blood pressure 140/80  HENT:  Head: Normocephalic.  Right Ear: External ear normal.  Left Ear: External ear normal.       Erythema of the oropharynx  Eyes: Conjunctivae and EOM are normal. Pupils are equal, round, and reactive to light.  Neck: Normal range of motion. Neck supple. No thyromegaly present.    No cervical adenopathy  Cardiovascular: Normal rate, regular rhythm, normal heart sounds and intact distal pulses.   Pulmonary/Chest: Effort normal and breath sounds normal. No respiratory distress. She has no wheezes.  Abdominal: Soft. Bowel sounds are normal. She exhibits no mass. There is no tenderness.  Musculoskeletal: Normal range of motion.  Lymphadenopathy:    She has no cervical adenopathy.  Neurological: She is alert and oriented to person, place, and time.  Skin: Skin is warm and dry. No rash noted.  Psychiatric: She has a normal mood and affect. Her behavior is normal.          Assessment & Plan:   Viral URI. Will continue Advil we'll treat with Hydromet rapid strep screen negative

## 2012-03-23 NOTE — Patient Instructions (Signed)
Get plenty of rest, Drink lots of  clear liquids, and use Tylenol or ibuprofen for fever and discomfort.    Take cough medication as directed  Call or return to clinic prn if these symptoms worsen or fail to improve as anticipated.

## 2012-03-27 ENCOUNTER — Ambulatory Visit: Payer: BC Managed Care – PPO | Admitting: Endocrinology

## 2012-04-03 ENCOUNTER — Ambulatory Visit: Payer: BC Managed Care – PPO | Admitting: *Deleted

## 2012-04-03 ENCOUNTER — Ambulatory Visit: Payer: BC Managed Care – PPO

## 2012-04-03 ENCOUNTER — Other Ambulatory Visit: Payer: BC Managed Care – PPO

## 2012-05-03 ENCOUNTER — Ambulatory Visit: Payer: BC Managed Care – PPO

## 2012-05-03 ENCOUNTER — Ambulatory Visit: Payer: BC Managed Care – PPO | Admitting: *Deleted

## 2012-05-08 ENCOUNTER — Encounter: Payer: Self-pay | Admitting: Gastroenterology

## 2012-06-13 ENCOUNTER — Other Ambulatory Visit: Payer: Self-pay | Admitting: Internal Medicine

## 2012-06-16 ENCOUNTER — Other Ambulatory Visit: Payer: Self-pay | Admitting: Internal Medicine

## 2012-06-19 ENCOUNTER — Encounter: Payer: Self-pay | Admitting: Internal Medicine

## 2012-06-19 ENCOUNTER — Ambulatory Visit (INDEPENDENT_AMBULATORY_CARE_PROVIDER_SITE_OTHER): Payer: BC Managed Care – PPO | Admitting: Internal Medicine

## 2012-06-19 VITALS — BP 140/90 | HR 70 | Temp 98.3°F | Wt 150.0 lb

## 2012-06-19 DIAGNOSIS — K7689 Other specified diseases of liver: Secondary | ICD-10-CM

## 2012-06-19 DIAGNOSIS — Z23 Encounter for immunization: Secondary | ICD-10-CM

## 2012-06-19 DIAGNOSIS — I10 Essential (primary) hypertension: Secondary | ICD-10-CM

## 2012-06-19 DIAGNOSIS — Z0000 Encounter for general adult medical examination without abnormal findings: Secondary | ICD-10-CM

## 2012-06-19 DIAGNOSIS — E876 Hypokalemia: Secondary | ICD-10-CM

## 2012-06-19 DIAGNOSIS — E119 Type 2 diabetes mellitus without complications: Secondary | ICD-10-CM

## 2012-06-19 DIAGNOSIS — K760 Fatty (change of) liver, not elsewhere classified: Secondary | ICD-10-CM

## 2012-06-19 LAB — BASIC METABOLIC PANEL
Chloride: 100 mEq/L (ref 96–112)
Creatinine, Ser: 0.8 mg/dL (ref 0.4–1.2)
GFR: 84.37 mL/min (ref >60.00)
Potassium: 3.7 mEq/L (ref 3.5–5.1)

## 2012-06-19 LAB — HEMOGLOBIN A1C: Hgb A1c MFr Bld: 7 % — ABNORMAL HIGH (ref 4.6–6.5)

## 2012-06-19 LAB — HEPATIC FUNCTION PANEL
Albumin: 4.1 g/dL (ref 3.5–5.2)
Alkaline Phosphatase: 60 U/L (ref 39–117)
Total Protein: 7.9 g/dL (ref 6.0–8.3)

## 2012-06-19 LAB — MAGNESIUM: Magnesium: 1.7 mg/dL (ref 1.5–2.5)

## 2012-06-19 NOTE — Patient Instructions (Addendum)
Intensify lifestyle interventions.  Someone will contact you about nutrition referral.  Continue with  Metformin consider try BP medication at night to see if am BP readings are better.  ROV in 4 months or as needed . Lab pre visit. Due for last hep a b then

## 2012-06-19 NOTE — Progress Notes (Signed)
  Subjective:    Patient ID: Amber [name], female    DOB: 01-06-1966, 46 y.o.   MRN: [id number]  HPI Patient comes in today for follow up of  multiple medical problems.  Comes here with dauaghter  Dr Everardo All.  bg on actos plus.  Samples    Stimulating appetite  acto met .  So went back to plain metformin . Says bg are ok.  Bp readings high today  140 but good at home and usually when checked .  117/70  No eye check.  Potassium not taking supplement as rxed . NO cp sob  Didn't get fu labs either yet.   Review of Systems Little toe numbness no cramps. No cps ob  No v d no bleeding  Had ha recently  No vision change no fever  Past history family history social history reviewed in the electronic medical record.     Objective:   Physical Exam BP 140/90  Pulse 70  Temp 98.3 F (36.8 C) (Oral)  Wt 150 lb (68.04 kg)  SpO2 98% WDWN in nad HEENT grossly normal Neck: Supple without adenopathy or masses or bruits Chest:  Clear to A&P without wheezes rales or rhonchi CV:  S1-S2 no gallops or murmurs peripheral perfusion is normal Abdomen:  Sof,t normal bowel sounds without hepatosplenomegaly, no guarding rebound or masses no CVA tenderness No stria  No clubbing cyanosis or edema Feet no unusual change callous  Ulcers  Sense grossly intact.      Assessment & Plan:   Dm: due a1c and also slight inc proteinuria on  Last screen   Try taking med at noght . Had se of actos  Want to do Intensify LSI  Due for twin rix.  Hx of hepatic steatosis   Neg hep c and b  Getting twin rix series.  HT borderline up today  Recheck  Low K in pat recheck no cramps at present. Not taking supp just increasing K rich food.  HA  Non focal exam and no alarm features  .  Never was able to be contacted in regard to GI referral and didn't address today. Not aware till pt left and other issues

## 2012-06-23 ENCOUNTER — Other Ambulatory Visit: Payer: Self-pay | Admitting: Family Medicine

## 2012-06-23 MED ORDER — METFORMIN HCL 500 MG PO TABS: ORAL_TABLET | ORAL | Status: DC

## 2012-07-11 ENCOUNTER — Other Ambulatory Visit: Payer: Self-pay | Admitting: Family Medicine

## 2012-07-11 ENCOUNTER — Other Ambulatory Visit: Payer: Self-pay | Admitting: Internal Medicine

## 2012-07-11 MED ORDER — LISINOPRIL-HYDROCHLOROTHIAZIDE 20-25 MG PO TABS: 1.0000 | ORAL_TABLET | Freq: Every day | ORAL | Status: DC

## 2012-07-12 ENCOUNTER — Ambulatory Visit (INDEPENDENT_AMBULATORY_CARE_PROVIDER_SITE_OTHER): Payer: BC Managed Care – PPO | Admitting: Endocrinology

## 2012-07-12 ENCOUNTER — Encounter: Payer: Self-pay | Admitting: Endocrinology

## 2012-07-12 VITALS — BP 102/80 | HR 70 | Temp 98.3°F | Ht 62.0 in | Wt 151.0 lb

## 2012-07-12 DIAGNOSIS — K7689 Other specified diseases of liver: Secondary | ICD-10-CM

## 2012-07-12 DIAGNOSIS — E785 Hyperlipidemia, unspecified: Secondary | ICD-10-CM

## 2012-07-12 DIAGNOSIS — K760 Fatty (change of) liver, not elsewhere classified: Secondary | ICD-10-CM

## 2012-07-12 DIAGNOSIS — E119 Type 2 diabetes mellitus without complications: Secondary | ICD-10-CM

## 2012-07-12 MED ORDER — PIOGLITAZONE HCL-METFORMIN HCL 15-500 MG PO TABS: 2.0000 | ORAL_TABLET | Freq: Every day | ORAL | Status: DC

## 2012-07-12 NOTE — Progress Notes (Signed)
Subjective:    Patient ID: Amber [name], female    DOB: [birth year], 46 y.o.   MRN: [id number]  HPI The state of at least three ongoing medical problems is addressed today: pt returns for f/u of type 2 DM (dx'ed 2003; no chronic complications.  She has never been on insulin.  She stopped actos due to increased appetite.   She then resumed it 3 weeks ago, when labs were found to be abnormal again. NASH: She feels abd girth has decreased since back on the actos.   PCOS: she has no menses, since endometrial ablation last year. Past Medical History  Diagnosis Date  . Diabetes mellitus   . Hyperlipidemia   . Hypertension   . Anemia     Past Surgical History  Procedure Date  . No past surgeries   . Dilation and curettage of uterus 09/2011  . Blood trans 08/2011  . Ablastion 09/2011    History   Social History  . Marital Status: Married    Spouse Name: N/A    Number of Children: 1  . Years of Education: BS   Occupational History  . Acupuncturist    Social History Main Topics  . Smoking status: Never Smoker   . Smokeless tobacco: Not on file  . Alcohol Use: No  . Drug Use: No  . Sexually Active: Yes    Birth Control/ Protection: Pill   Other Topics Concern  . 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     Current Outpatient Prescriptions on File Prior to Visit  Medication Sig Dispense Refill  . glucose blood test strip Use bid  100 each  12  . Lancets Thin MISC Use bid- pt needs lancets that goes with the contour machine  100 each  11  . lisinopril-hydrochlorothiazide (PRINZIDE,ZESTORETIC) 20-25 MG per tablet Take 1 tablet by mouth daily.  90 tablet  1  . DISCONTD: IRON PO Take 1 tablet by mouth 3 (three) times daily after meals.        Marland Kitchen DISCONTD: Norethin-Eth Estrad-Fe Biphas (LO LOESTRIN FE PO) Take 1 tablet by mouth 2 (two) times daily.         Marland Kitchen DISCONTD: pioglitazone (ACTOS) 30 MG tablet Take 30 mg by mouth daily.          Allergies  Allergen Reactions  . Other Rash    Flu vaccine    Family History  Problem Relation Age of Onset  . Hypertension Mother   . Diabetes Father   . Diabetes Other     Grandparent  . Stroke Other     Grandparent  . Hyperlipidemia      parents  . Developmental delay Daughter     BP 102/80  Pulse 70  Temp 98.3 F (36.8 C) (Oral)  Ht 5\' 2"  (1.575 m)  Wt 151 lb (68.493 kg)  BMI 27.62 kg/m2  SpO2 97%    Review of Systems She has lost a few lbs, due to her efforts.  Denies edema    Objective:   Physical Exam VITAL SIGNS:  See vs page GENERAL: no distress Pulses: dorsalis pedis intact bilat.   Feet: no deformity.  no ulcer on the feet.  feet are of normal color and temp.  no edema Neuro: sensation is intact to touch on the feet    Lab Results  Component Value Date  WBC 7.1 02/25/2012   HGB 12.6 02/25/2012   HCT 38.0 02/25/2012   PLT 213.0 02/25/2012   GLUCOSE 126* 06/19/2012   CHOL 185 02/25/2012   TRIG 131.0 02/25/2012   HDL 64.30 02/25/2012   LDLDIRECT 117.7 06/07/2011   LDLCALC 95 02/25/2012   ALT 44* 06/19/2012   AST 33 06/19/2012   NA 139 06/19/2012   K 3.7 06/19/2012   CL 100 06/19/2012   CREATININE 0.8 06/19/2012   BUN 11 06/19/2012   CO2 27 06/19/2012   TSH 2.20 02/25/2012   INR 1.03 09/12/2011   HGBA1C 7.0* 06/19/2012   MICROALBUR 10.8* 06/19/2012      Assessment & Plan:  DM.  Needs increased rx, if it can be done with a regimen that avoids or minimizes hypoglycemia. NASH is improved PCOS.  She has had endometrial ablation, so we can't eval menses

## 2012-07-12 NOTE — Patient Instructions (Addendum)
Please take the actos + metformin.  i have sent a prescription to your pharmacy Please recheck your blood tests in October.  You will receive a letter with results. Please return in 1 year.

## 2012-08-23 ENCOUNTER — Encounter: Payer: Self-pay | Admitting: Endocrinology

## 2012-08-23 ENCOUNTER — Ambulatory Visit (INDEPENDENT_AMBULATORY_CARE_PROVIDER_SITE_OTHER): Payer: BC Managed Care – PPO | Admitting: Endocrinology

## 2012-08-23 VITALS — BP 108/66 | HR 70 | Temp 97.0°F | Ht 62.0 in | Wt 155.0 lb

## 2012-08-23 DIAGNOSIS — E119 Type 2 diabetes mellitus without complications: Secondary | ICD-10-CM

## 2012-08-23 MED ORDER — METFORMIN HCL ER 500 MG PO TB24: 1000.0000 mg | ORAL_TABLET | Freq: Two times a day (BID) | ORAL | Status: DC

## 2012-08-23 NOTE — Progress Notes (Signed)
  Subjective:    Patient ID: Amber [name], female    DOB: 08-13-1966, 46 y.o.   MRN: [id number]  HPI pt returns for f/u of type 2 DM (dx'ed 2003; no chronic complications.  She has never been on insulin.  She says actos has caused weight gain, facial swelling, tremor, and excessive diaphoresis.     Past Medical History  Diagnosis Date  . Diabetes mellitus   . Hyperlipidemia   . Hypertension   . Anemia     Past Surgical History  Procedure Date  . No past surgeries   . Dilation and curettage of uterus 09/2011  . Blood trans 08/2011  . Ablastion 09/2011    History   Social History  . Marital Status: Married    Spouse Name: N/A    Number of Children: 1  . Years of Education: BS   Occupational History  . Acupuncturist    Social History Main Topics  . Smoking status: Never Smoker   . Smokeless tobacco: Not on file  . Alcohol Use: No  . Drug Use: No  . Sexually Active: Yes    Birth Control/ Protection: Pill   Other Topics Concern  . 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     Current Outpatient Prescriptions on File Prior to Visit  Medication Sig Dispense Refill  . glucose blood test strip Use bid  100 each  12  . Lancets Thin MISC Use bid- pt needs lancets that goes with the contour machine  100 each  11  . lisinopril-hydrochlorothiazide (PRINZIDE,ZESTORETIC) 20-25 MG per tablet Take 1 tablet by mouth daily.  90 tablet  1  . DISCONTD: IRON PO Take 1 tablet by mouth 3 (three) times daily after meals.        Marland Kitchen DISCONTD: Norethin-Eth Estrad-Fe Biphas (LO LOESTRIN FE PO) Take 1 tablet by mouth 2 (two) times daily.        Marland Kitchen DISCONTD: pioglitazone (ACTOS) 30 MG tablet Take 30 mg by mouth daily.          Allergies  Allergen Reactions  . Actos (Pioglitazone)     Weight gain  . Other Rash    Flu vaccine    Family History  Problem  Relation Age of Onset  . Hypertension Mother   . Diabetes Father   . Diabetes Other     Grandparent  . Stroke Other     Grandparent  . Hyperlipidemia      parents  . Developmental delay Daughter     BP 108/66  Pulse 70  Temp 97 F (36.1 C) (Oral)  Ht 5\' 2"  (1.575 m)  Wt 155 lb (70.308 kg)  BMI 28.35 kg/m2  SpO2 97%   Review of Systems Denies leg edema    Objective:   Physical Exam VITAL SIGNS:  See vs page GENERAL: no distress Ext: no edema  Lab Results  Component Value Date   HGBA1C 7.0* 06/19/2012      Assessment & Plan:  DM, well-controlled Edema adn other sxs prob due to actos

## 2012-08-23 NOTE — Patient Instructions (Addendum)
Stop actos Continue metformin, 2 per day Please come back for a follow-up appointment in 3 months.

## 2012-10-13 ENCOUNTER — Other Ambulatory Visit (INDEPENDENT_AMBULATORY_CARE_PROVIDER_SITE_OTHER): Payer: BC Managed Care – PPO

## 2012-10-13 DIAGNOSIS — K7689 Other specified diseases of liver: Secondary | ICD-10-CM

## 2012-10-13 DIAGNOSIS — I10 Essential (primary) hypertension: Secondary | ICD-10-CM

## 2012-10-13 DIAGNOSIS — Z9189 Other specified personal risk factors, not elsewhere classified: Secondary | ICD-10-CM

## 2012-10-13 DIAGNOSIS — E119 Type 2 diabetes mellitus without complications: Secondary | ICD-10-CM

## 2012-10-13 DIAGNOSIS — E785 Hyperlipidemia, unspecified: Secondary | ICD-10-CM

## 2012-10-13 DIAGNOSIS — Z9289 Personal history of other medical treatment: Secondary | ICD-10-CM

## 2012-10-13 DIAGNOSIS — K760 Fatty (change of) liver, not elsewhere classified: Secondary | ICD-10-CM

## 2012-10-13 DIAGNOSIS — K6289 Other specified diseases of anus and rectum: Secondary | ICD-10-CM

## 2012-10-13 DIAGNOSIS — E876 Hypokalemia: Secondary | ICD-10-CM

## 2012-10-13 LAB — BASIC METABOLIC PANEL
Chloride: 102 mEq/L (ref 96–112)
Creatinine, Ser: 0.7 mg/dL (ref 0.4–1.2)
Sodium: 138 mEq/L (ref 135–145)

## 2012-10-13 LAB — HEPATIC FUNCTION PANEL
Albumin: 3.7 g/dL (ref 3.5–5.2)
Alkaline Phosphatase: 52 U/L (ref 39–117)
Total Protein: 7.3 g/dL (ref 6.0–8.3)

## 2012-10-13 LAB — MAGNESIUM: Magnesium: 1.9 mg/dL (ref 1.5–2.5)

## 2012-10-13 LAB — HEMOGLOBIN A1C: Hgb A1c MFr Bld: 5.8 % (ref 4.6–6.5)

## 2012-10-13 NOTE — Addendum Note (Signed)
Addended by: Serena Colonel on: 10/13/2012 08:38 AM   Modules accepted: Orders

## 2012-10-16 ENCOUNTER — Other Ambulatory Visit: Payer: Self-pay | Admitting: Internal Medicine

## 2012-10-16 NOTE — Telephone Encounter (Signed)
Rx to pharmacy/SLS 

## 2012-10-20 ENCOUNTER — Encounter: Payer: Self-pay | Admitting: Internal Medicine

## 2012-10-20 ENCOUNTER — Ambulatory Visit (INDEPENDENT_AMBULATORY_CARE_PROVIDER_SITE_OTHER): Payer: BC Managed Care – PPO | Admitting: Internal Medicine

## 2012-10-20 VITALS — BP 120/80 | HR 71 | Temp 97.5°F | Wt 156.0 lb

## 2012-10-20 DIAGNOSIS — E282 Polycystic ovarian syndrome: Secondary | ICD-10-CM

## 2012-10-20 DIAGNOSIS — K760 Fatty (change of) liver, not elsewhere classified: Secondary | ICD-10-CM

## 2012-10-20 DIAGNOSIS — E119 Type 2 diabetes mellitus without complications: Secondary | ICD-10-CM

## 2012-10-20 DIAGNOSIS — I10 Essential (primary) hypertension: Secondary | ICD-10-CM

## 2012-10-20 DIAGNOSIS — Z23 Encounter for immunization: Secondary | ICD-10-CM

## 2012-10-20 DIAGNOSIS — K7689 Other specified diseases of liver: Secondary | ICD-10-CM

## 2012-10-20 NOTE — Patient Instructions (Signed)
Continue healthy lifestyle and the metformin. And her blood pressure medication.  Exercise and healthy diet are very important to keep diabetes from progressing.  Laboratory tests and a CPX in about 4 months to include a full set of labs hemoglobin A1c and a urine micro-albumin protein.  If we are having problems controlling the for diabetes we can arrange a referral. Contact us in the meantime if needed  Flu vaccine and last hepatitis B vaccine today

## 2012-10-20 NOTE — Progress Notes (Signed)
Chief Complaint  Patient presents with  . Follow-up    Labs    HPI: Patient comes in today for follow up of  multiple medical problems.  DM  No lows feels pretty good. Tried  actos /metformin  Sample and then went to see dr Everardo All  Had se of this with swelling and elevated  Hr.  So stopped and  Increase   metformin 1500 - 2000 per day depending on tolerance.   Bp feeling good   No ringing  In ears anymore at night  .  Liver No  . Change in vision falling  Neuro sx  ROS: See pertinent positives and negatives per HPI.   Fever unusual infections recent infection  Past Medical History  Diagnosis Date  . Diabetes mellitus   . Hyperlipidemia   . Hypertension   . Anemia     Family History  Problem Relation Age of Onset  . Hypertension Mother   . Diabetes Father   . Diabetes Other     Grandparent  . Stroke Other     Grandparent  . Hyperlipidemia      parents  . Developmental delay Daughter     History   Social History  . Marital Status: Married    Spouse Name: N/A    Number of Children: 1  . Years of Education: BS   Occupational History  . Acupuncturist    Social History Main Topics  . Smoking status: Never Smoker   . Smokeless tobacco: None  . Alcohol Use: No  . Drug Use: No  . Sexually Active: Yes    Birth Control/ Protection: Pill   Other Topics Concern  . 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     Current outpatient prescriptions:glucose blood test strip, Use bid, Disp: 100 each, Rfl: 12;  Lancets Thin MISC, Use bid- pt needs lancets that goes with the contour machine, Disp: 100 each, Rfl: 11;  lisinopril-hydrochlorothiazide (PRINZIDE,ZESTORETIC) 20-25 MG per tablet, TAKE 1 TABLET BY MOUTH ONCE A DAY, Disp: 90 tablet, Rfl: 1;  metFORMIN (GLUCOPHAGE-XR) 500 MG 24 hr tablet, Take 500 mg by mouth 2 (two) times daily., Disp: , Rfl:    DISCONTD: IRON PO, Take 1 tablet by mouth 3 (three) times daily after meals.  , Disp: , Rfl: ;  DISCONTD: Norethin-Eth Estrad-Fe Biphas (LO LOESTRIN FE PO), Take 1 tablet by mouth 2 (two) times daily.  , Disp: , Rfl: ;  DISCONTD: pioglitazone (ACTOS) 30 MG tablet, Take 30 mg by mouth daily.  , Disp: , Rfl:   EXAM: BP 120/80  Pulse 71  Temp 97.5 F (36.4 C) (Oral)  Wt 156 lb (70.761 kg)  SpO2 96% GENERAL: vitals reviewed and listed above, alert, oriented, appears well hydrated and in no acute distress Wt Readings from Last 3 Encounters:  10/20/12 156 lb (70.761 kg)  08/23/12 155 lb (70.308 kg)  07/12/12 151 lb (68.493 kg)   HEENT: atraumatic, conjunttiva clear, no obvious abnormalities on inspection of external nose and ears   NECK: no obvious masses on inspection palpation  No bruits   LUNGS: clear to auscultation bilaterally, no wheezes, rales or rhonchi, good air movement  CV: HRRR, no clubbing cyanosis or  peripheral edema nl cap refill  Abdomen:  Sof,t normal bowel sounds without hepatosplenomegaly, no guarding rebound or masses no CVA tenderness MS: moves all extremities without noticeable  focal  abnormality  PSYCH: pleasant and cooperative, no obvious depression or anxiety Lab Results  Component Value Date   WBC 7.1 02/25/2012   HGB 12.6 02/25/2012   HCT 38.0 02/25/2012   PLT 213.0 02/25/2012   GLUCOSE 104* 10/13/2012   CHOL 185 02/25/2012   TRIG 131.0 02/25/2012   HDL 64.30 02/25/2012   LDLDIRECT 117.7 06/07/2011   LDLCALC 95 02/25/2012   ALT 21 10/13/2012   AST 20 10/13/2012   NA 138 10/13/2012   K 3.8 10/13/2012   CL 102 10/13/2012   CREATININE 0.7 10/13/2012   BUN 11 10/13/2012   CO2 28 10/13/2012   TSH 2.20 02/25/2012   INR 1.03 09/12/2011   HGBA1C 5.8 10/13/2012   MICROALBUR 10.8* 06/19/2012    ASSESSMENT AND PLAN:  Discussed the following assessment and plan:  1. Hypertension    2. Type II or unspecified type diabetes mellitus without mention of complication, not  stated as uncontrolled     much improved   poss from new metformin adjsutment using prn actos  follow   3. Need for prophylactic vaccination and inoculation against influenza    4. Need for hepatitis A and B vaccination  Hepatitis A hepatitis B combined vaccine IM  5. PCOS (polycystic ovarian syndrome)    6. Hepatic steatosis     Last  Twin rix and flu vaccine today. Pt report that if she would like to see same endo that her daughter sees if needed but for now that she is doing well will follow  As below  -  Patient Instructions  Continue healthy lifestyle and the metformin. And her blood pressure medication.  Exercise and healthy diet are very important to keep diabetes from progressing.  Laboratory tests and a CPX in about 4 months to include a full set of labs hemoglobin A1c and a urine micro-albumin protein.  If we are having problems controlling the for diabetes we can arrange a referral. Contact us in the meantime if needed  Flu vaccine and last hepatitis B vaccine today   Lorretta Harp

## 2012-10-23 ENCOUNTER — Other Ambulatory Visit: Payer: Self-pay | Admitting: Family Medicine

## 2012-10-23 DIAGNOSIS — Z0000 Encounter for general adult medical examination without abnormal findings: Secondary | ICD-10-CM

## 2013-01-10 ENCOUNTER — Other Ambulatory Visit: Payer: Self-pay | Admitting: Internal Medicine

## 2013-02-19 ENCOUNTER — Other Ambulatory Visit: Payer: BC Managed Care – PPO

## 2013-02-21 ENCOUNTER — Other Ambulatory Visit: Payer: BC Managed Care – PPO

## 2013-02-28 ENCOUNTER — Encounter: Payer: BC Managed Care – PPO | Admitting: Internal Medicine

## 2013-03-12 ENCOUNTER — Encounter: Payer: Self-pay | Admitting: Internal Medicine

## 2013-03-12 ENCOUNTER — Ambulatory Visit (INDEPENDENT_AMBULATORY_CARE_PROVIDER_SITE_OTHER): Payer: BC Managed Care – PPO | Admitting: Internal Medicine

## 2013-03-12 VITALS — BP 118/86 | HR 64 | Temp 97.7°F | Wt 156.0 lb

## 2013-03-12 DIAGNOSIS — E119 Type 2 diabetes mellitus without complications: Secondary | ICD-10-CM

## 2013-03-12 DIAGNOSIS — T50905A Adverse effect of unspecified drugs, medicaments and biological substances, initial encounter: Secondary | ICD-10-CM

## 2013-03-12 DIAGNOSIS — I10 Essential (primary) hypertension: Secondary | ICD-10-CM

## 2013-03-12 DIAGNOSIS — E785 Hyperlipidemia, unspecified: Secondary | ICD-10-CM

## 2013-03-12 DIAGNOSIS — R1110 Vomiting, unspecified: Secondary | ICD-10-CM

## 2013-03-12 DIAGNOSIS — R110 Nausea: Secondary | ICD-10-CM

## 2013-03-12 DIAGNOSIS — K760 Fatty (change of) liver, not elsewhere classified: Secondary | ICD-10-CM

## 2013-03-12 DIAGNOSIS — K7689 Other specified diseases of liver: Secondary | ICD-10-CM

## 2013-03-12 DIAGNOSIS — R7989 Other specified abnormal findings of blood chemistry: Secondary | ICD-10-CM

## 2013-03-12 DIAGNOSIS — R945 Abnormal results of liver function studies: Secondary | ICD-10-CM

## 2013-03-12 DIAGNOSIS — T887XXA Unspecified adverse effect of drug or medicament, initial encounter: Secondary | ICD-10-CM

## 2013-03-12 LAB — HEPATIC FUNCTION PANEL
AST: 38 U/L — ABNORMAL HIGH (ref 0–37)
Albumin: 4.1 g/dL (ref 3.5–5.2)
Alkaline Phosphatase: 65 U/L (ref 39–117)
Bilirubin, Direct: 0 mg/dL (ref 0.0–0.3)

## 2013-03-12 LAB — BASIC METABOLIC PANEL
GFR: 96.89 mL/min (ref >60.00)
Potassium: 3.8 mEq/L (ref 3.5–5.1)
Sodium: 136 mEq/L (ref 135–145)

## 2013-03-12 LAB — HEMOGLOBIN A1C: Hgb A1c MFr Bld: 6.5 % (ref 4.6–6.5)

## 2013-03-12 LAB — LIPID PANEL: VLDL: 98 mg/dL — ABNORMAL HIGH (ref 0.0–40.0)

## 2013-03-12 LAB — CBC WITH DIFFERENTIAL/PLATELET
Eosinophils Relative: 1.1 % (ref 0.0–5.0)
HCT: 39 % (ref 36.0–46.0)
Hemoglobin: 13.9 g/dL (ref 12.0–15.0)
Lymphocytes Relative: 33.8 % (ref 12.0–46.0)
Lymphs Abs: 2.9 10*3/uL (ref 0.7–4.0)
Monocytes Relative: 4.7 % (ref 3.0–12.0)
Platelets: 233 10*3/uL (ref 150.0–400.0)
WBC: 8.5 10*3/uL (ref 4.5–10.5)

## 2013-03-12 LAB — POCT URINALYSIS DIPSTICK
Blood, UA: NEGATIVE
Spec Grav, UA: 1.015
Urobilinogen, UA: 0.2
pH, UA: 7

## 2013-03-12 LAB — MICROALBUMIN / CREATININE URINE RATIO
Creatinine,U: 51.7 mg/dL
Microalb, Ur: 25 mg/dL — ABNORMAL HIGH (ref 0.0–1.9)

## 2013-03-12 LAB — MAGNESIUM: Magnesium: 2 mg/dL (ref 1.5–2.5)

## 2013-03-12 LAB — TSH: TSH: 2.48 u[IU]/mL (ref 0.35–5.50)

## 2013-03-12 MED ORDER — METFORMIN HCL ER 750 MG PO TB24: 750.0000 mg | ORAL_TABLET | Freq: Every day | ORAL | Status: DC

## 2013-03-12 MED ORDER — METFORMIN HCL ER 750 MG PO TB24: 1500.0000 mg | ORAL_TABLET | Freq: Every day | ORAL | Status: DC

## 2013-03-12 NOTE — Patient Instructions (Addendum)
Uncertain why you're having intermittent vomiting sometimes metformin can cause diarrhea but usually not vomiting. excess magnesium can cause diarrhea  We'll get laboratory tests to include hemoglobin A1c and a urinalysis and chemistry levels. today and consider getting an abdominal ultrasound check.  Try to have some protein with breakfast.it can help with bloating and loose stools.  We'll decide on needed further evaluation depending on the lab tests and how you're doing.

## 2013-03-12 NOTE — Progress Notes (Signed)
Chief Complaint  Patient presents with  . Follow-up    Cannot tolerate 2000mg  of metformin.  She is also breaking out in hives and rash when she walks.    HPI: Last ov   Was in the fall 13 Fu was supposed to be cpx with labs and dm labs however   Had problems with medication and things got chnges   She began taking her daughter 2-750 met and no sig se with that? NOw co fo bloating and ocass vomiting without that much pain   waxing and waning  For 2 months that she doesn't think is the medicine, no fever diarrhea  Took a vitamin and got better? BG are usually 120 in am   no numbness unusual infections Ate 830 this am  HT doing ok on meds  ROS: See pertinent positives and negatives per HPI.  Past Medical History  Diagnosis Date  . Diabetes mellitus   . Hyperlipidemia   . Hypertension   . Anemia     Family History  Problem Relation Age of Onset  . Hypertension Mother   . Diabetes Father   . Diabetes Other     Grandparent  . Stroke Other     Grandparent  . Hyperlipidemia      parents  . Developmental delay Daughter     History   Social History  . Marital Status: Married    Spouse Name: N/A    Number of Children: 1  . Years of Education: BS   Occupational History  . Acupuncturist    Social History Main Topics  . Smoking status: Never Smoker   . Smokeless tobacco: None  . Alcohol Use: No  . Drug Use: No  . Sexually Active: Yes    Birth Control/ Protection: Pill   Other Topics Concern  . 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     Outpatient Encounter Prescriptions as of 03/12/2013  Medication Sig Dispense Refill  . glucose blood test strip Use bid  100 each  12  . Lancets Thin MISC Use bid- pt needs lancets that goes with the contour machine  100 each  11  . lisinopril-hydrochlorothiazide  (PRINZIDE,ZESTORETIC) 20-25 MG per tablet TAKE 1 TABLET BY MOUTH ONCE DAILY  90 tablet  0  . [DISCONTINUED] metFORMIN (GLUCOPHAGE-XR) 500 MG 24 hr tablet Take 500 mg by mouth 2 (two) times daily.      . metFORMIN (GLUCOPHAGE-XR) 750 MG 24 hr tablet Take 2 tablets (1,500 mg total) by mouth daily with breakfast.  180 tablet  0  . [DISCONTINUED] metFORMIN (GLUCOPHAGE-XR) 750 MG 24 hr tablet Take 1 tablet (750 mg total) by mouth daily with breakfast.  180 tablet  3  . [DISCONTINUED] metFORMIN (GLUCOPHAGE-XR) 750 MG 24 hr tablet Take 1,500 mg by mouth daily with breakfast.       No facility-administered encounter medications on file as of 03/12/2013.    EXAM:  BP 118/86  Pulse 64  Temp(Src) 97.7 F (36.5 C) (Oral)  Wt 156 lb (70.761 kg)  BMI 28.53 kg/m2  SpO2 98%  Body mass index is 28.53 kg/(m^2).  GENERAL: vitals reviewed and listed above, alert, oriented, appears well hydrated and in no acute distress  HEENT: atraumatic, conjunctiva  clear, no obvious abnormalities on inspection  of external nose and ears OP : no lesion edema or exudate   NECK: no obvious masses on inspection palpation   LUNGS: clear to auscultation bilaterally, no wheezes, rales or rhonchi, good air movement Abdomen:  Sof,t normal bowel sounds without hepatosplenomegaly, no guarding rebound or masses no CVA tenderness mild discomfort non focal  CV: HRRR, no clubbing cyanosis or  peripheral edema nl cap refill  Skin: normal capillary refill ,turgor , color: No acute rashes ,petechiae or bruising  MS: moves all extremities without noticeable focal  abnormality  PSYCH: pleasant and cooperative, no obvious depression or anxiety Lab Results  Component Value Date   WBC 8.5 03/12/2013   HGB 13.9 03/12/2013   HCT 39.0 03/12/2013   PLT 233.0 03/12/2013   GLUCOSE 152* 03/12/2013   CHOL 199 03/12/2013   TRIG 490.0 Triglyceride is over 400; calculations on Lipids are invalid.* 03/12/2013   HDL 45.80 03/12/2013   LDLDIRECT 81.2  03/12/2013   LDLCALC 95 02/25/2012   ALT 49* 03/12/2013   AST 38* 03/12/2013   NA 136 03/12/2013   K 3.8 03/12/2013   CL 97 03/12/2013   CREATININE 0.7 03/12/2013   BUN 11 03/12/2013   CO2 28 03/12/2013   TSH 2.48 03/12/2013   INR 1.03 09/12/2011   HGBA1C 6.5 03/12/2013   MICROALBUR 25.0* 03/12/2013    ASSESSMENT AND PLAN:  Discussed the following assessment and plan:  Type II or unspecified type diabetes mellitus without mention of complication, not stated as uncontrolled - ate 830 this am before labs - Plan: Basic metabolic panel, CBC with Differential, Hemoglobin A1c, Hepatic function panel, Lipid panel, TSH, Microalbumin / creatinine urine ratio, POCT urinalysis dipstick, Magnesium  Hypertension - Plan: Basic metabolic panel, CBC with Differential, Hemoglobin A1c, Hepatic function panel, Lipid panel, TSH, Microalbumin / creatinine urine ratio, POCT urinalysis dipstick, Magnesium  Hyperlipidemia - Plan: Basic metabolic panel, CBC with Differential, Hemoglobin A1c, Hepatic function panel, Lipid panel, TSH, Microalbumin / creatinine urine ratio, POCT urinalysis dipstick, Magnesium, US Abdomen Complete  Nausea - Plan: Basic metabolic panel, CBC with Differential, Hemoglobin A1c, Hepatic function panel, Lipid panel, TSH, Microalbumin / creatinine urine ratio, POCT urinalysis dipstick, Magnesium, US Abdomen Complete  Hepatic steatosis - ? lasat [place]  had neg hep b and c serology last year; had twin rix . get ultrasound  - Plan: Basic metabolic panel, CBC with Differential, Hemoglobin A1c, Hepatic function panel, Lipid panel, TSH, Microalbumin / creatinine urine ratio, POCT urinalysis dipstick, Magnesium, US Abdomen Complete  Intermittent vomiting - uncertain cause - Plan: US Abdomen Complete  Medication side effect, initial encounter - high dose metformin  ok on 1500 mg  Abnormal LFTs - Plan: US Abdomen Complete Counseled. Plan follow up.  -Patient advised to return or notify health care team  if  symptoms worsen or persist or new concerns arise.  Patient Instructions  Uncertain why you're having intermittent vomiting sometimes metformin can cause diarrhea but usually not vomiting. excess magnesium can cause diarrhea  We'll get laboratory tests to include hemoglobin A1c and a urinalysis and chemistry levels. today and consider getting an abdominal ultrasound check.  Try to have some protein with breakfast.it can help with bloating and loose stools.  We'll decide on needed further evaluation depending on the lab tests and how you're doing.       Neta Mends. Milinda Sweeney M.D.  See above will proceed with abd [place]  And follow up.

## 2013-03-15 DIAGNOSIS — R945 Abnormal results of liver function studies: Secondary | ICD-10-CM | POA: Insufficient documentation

## 2013-03-15 DIAGNOSIS — R1110 Vomiting, unspecified: Secondary | ICD-10-CM | POA: Insufficient documentation

## 2013-03-15 DIAGNOSIS — T887XXA Unspecified adverse effect of drug or medicament, initial encounter: Secondary | ICD-10-CM | POA: Insufficient documentation

## 2013-03-28 ENCOUNTER — Other Ambulatory Visit: Payer: Self-pay | Admitting: Family Medicine

## 2013-03-28 ENCOUNTER — Telehealth: Payer: Self-pay | Admitting: Family Medicine

## 2013-03-28 NOTE — Telephone Encounter (Signed)
The patient called and said she seen her lab work Therapist, sports by [employer].  She would like to know if she should see a nephrologist or a diabetes specialist.  Please advise.  Thanks!!

## 2013-04-03 NOTE — Telephone Encounter (Signed)
No reason to see  Kidney doctor  Her renal fun ction is normal and she is on an acei   We can  do an endocrinology consult  For the diabetes  If she wishes  But i am most concerned about her vomiting also / ,make sure she get s abdominal ultrasound

## 2013-04-04 NOTE — Telephone Encounter (Signed)
Patient called and left a message on my voicemail giving me permission to leave a detailed message on her home or cell.  Left a message informing the patient of all.  Instructed her to call back if she wanted to see the endocrinologist.

## 2013-04-04 NOTE — Telephone Encounter (Signed)
Left message on home and cell for the pt to return my call. 

## 2013-04-10 ENCOUNTER — Other Ambulatory Visit: Payer: Self-pay | Admitting: Internal Medicine

## 2013-05-21 ENCOUNTER — Other Ambulatory Visit (INDEPENDENT_AMBULATORY_CARE_PROVIDER_SITE_OTHER): Payer: BC Managed Care – PPO

## 2013-05-21 DIAGNOSIS — Z0000 Encounter for general adult medical examination without abnormal findings: Secondary | ICD-10-CM

## 2013-05-21 LAB — LIPID PANEL
Cholesterol: 184 mg/dL (ref 0–200)
HDL: 49.7 mg/dL (ref >39.00)
Triglycerides: 273 mg/dL — ABNORMAL HIGH (ref 0.0–149.0)

## 2013-05-21 LAB — CBC WITH DIFFERENTIAL/PLATELET
Basophils Absolute: 0 10*3/uL (ref 0.0–0.1)
Eosinophils Absolute: 0.1 10*3/uL (ref 0.0–0.7)
Hemoglobin: 13.5 g/dL (ref 12.0–15.0)
Lymphocytes Relative: 35.3 % (ref 12.0–46.0)
Monocytes Relative: 4.9 % (ref 3.0–12.0)
Neutro Abs: 4.7 10*3/uL (ref 1.4–7.7)
Neutrophils Relative %: 58.3 % (ref 43.0–77.0)
Platelets: 209 10*3/uL (ref 150.0–400.0)
RDW: 13.4 % (ref 11.5–14.6)

## 2013-05-21 LAB — LDL CHOLESTEROL, DIRECT: Direct LDL: 93.6 mg/dL

## 2013-05-21 LAB — BASIC METABOLIC PANEL
CO2: 28 mEq/L (ref 19–32)
Calcium: 9.2 mg/dL (ref 8.4–10.5)
Chloride: 99 mEq/L (ref 96–112)
Glucose, Bld: 128 mg/dL — ABNORMAL HIGH (ref 70–99)
Sodium: 136 mEq/L (ref 135–145)

## 2013-05-21 LAB — MICROALBUMIN / CREATININE URINE RATIO: Microalb Creat Ratio: 10.2 mg/g (ref 0.0–30.0)

## 2013-05-21 LAB — HEPATIC FUNCTION PANEL
ALT: 37 U/L — ABNORMAL HIGH (ref 0–35)
Albumin: 3.7 g/dL (ref 3.5–5.2)
Alkaline Phosphatase: 48 U/L (ref 39–117)
Total Protein: 7 g/dL (ref 6.0–8.3)

## 2013-05-21 LAB — TSH: TSH: 1.94 u[IU]/mL (ref 0.35–5.50)

## 2013-06-04 ENCOUNTER — Ambulatory Visit (INDEPENDENT_AMBULATORY_CARE_PROVIDER_SITE_OTHER): Payer: BC Managed Care – PPO | Admitting: Internal Medicine

## 2013-06-04 ENCOUNTER — Other Ambulatory Visit: Payer: Self-pay | Admitting: Internal Medicine

## 2013-06-04 ENCOUNTER — Encounter: Payer: Self-pay | Admitting: Internal Medicine

## 2013-06-04 VITALS — BP 130/84 | HR 62 | Temp 98.3°F | Wt 154.0 lb

## 2013-06-04 DIAGNOSIS — R945 Abnormal results of liver function studies: Secondary | ICD-10-CM

## 2013-06-04 DIAGNOSIS — R7989 Other specified abnormal findings of blood chemistry: Secondary | ICD-10-CM

## 2013-06-04 DIAGNOSIS — K760 Fatty (change of) liver, not elsewhere classified: Secondary | ICD-10-CM

## 2013-06-04 DIAGNOSIS — R197 Diarrhea, unspecified: Secondary | ICD-10-CM

## 2013-06-04 DIAGNOSIS — Z0000 Encounter for general adult medical examination without abnormal findings: Secondary | ICD-10-CM

## 2013-06-04 DIAGNOSIS — K7689 Other specified diseases of liver: Secondary | ICD-10-CM

## 2013-06-04 DIAGNOSIS — E119 Type 2 diabetes mellitus without complications: Secondary | ICD-10-CM

## 2013-06-04 DIAGNOSIS — Z23 Encounter for immunization: Secondary | ICD-10-CM

## 2013-06-04 DIAGNOSIS — E785 Hyperlipidemia, unspecified: Secondary | ICD-10-CM

## 2013-06-04 DIAGNOSIS — I10 Essential (primary) hypertension: Secondary | ICD-10-CM

## 2013-06-04 MED ORDER — METFORMIN HCL ER 750 MG PO TB24: 1500.0000 mg | ORAL_TABLET | Freq: Every day | ORAL | Status: DC

## 2013-06-04 MED ORDER — LISINOPRIL-HYDROCHLOROTHIAZIDE 20-25 MG PO TABS: ORAL_TABLET | ORAL | Status: DC

## 2013-06-04 NOTE — Progress Notes (Signed)
Chief Complaint  Patient presents with  . Annual Exam   No major change in health status since last visit . Bp up with stress.    And ringing in ears take asa  Better with less stress and massage  130 range  Otherwise ok Exercise tennis  And ballroom  And lost weight felt good but partner had to  Have surgery so weight came back . Blood sugars no lows.  Thinking about seeing a nutritionist dietitian to help with weight loss. No unusual infections. ROS:  GEN/ HEENT: No fever, significant weight changes sweats headaches vision problems hearing changes, CV/ PULM; No chest pain shortness of breath cough, syncope,edema  change in exercise tolerance. GI /GU: No adominal pain, vomiting, . No blood in the stool. No significant GU symptoms. Getting loose stools diarrhea like no blood SKIN/HEME: ,no acute skin rashes suspicious lesions or bleeding. No lymphadenopathy, nodules, masses.  NEURO/ PSYCH:  No neurologic signs such as weakness numbness. No depression anxiety. IMM/ Allergy: No unusual infections.  Allergy .   REST of 12 system review negative except as per HPI   Past Medical History  Diagnosis Date  . Diabetes mellitus   . Hyperlipidemia   . Hypertension   . Anemia     Family History  Problem Relation Age of Onset  . Hypertension Mother   . Diabetes Father   . Diabetes Other     Grandparent  . Stroke Other     Grandparent  . Hyperlipidemia      parents  . Developmental delay Daughter     History   Social History  . Marital Status: Married    Spouse Name: N/A    Number of Children: 1  . Years of Education: BS   Occupational History  . Acupuncturist    Social History Main Topics  . Smoking status: Never Smoker   . Smokeless tobacco: None  . Alcohol Use: No  . Drug Use: No  . Sexually Active: Yes    Birth Control/ Protection: Pill   Other Topics Concern  . 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     Outpatient Encounter Prescriptions as of 06/04/2013  Medication Sig Dispense Refill  . glucose blood test strip Use bid  100 each  12  . Lancets Thin MISC Use bid- pt needs lancets that goes with the contour machine  100 each  11  . lisinopril-hydrochlorothiazide (PRINZIDE,ZESTORETIC) 20-25 MG per tablet TAKE 1 TABLET BY MOUTH ONCE DAILY  90 tablet  3  . metFORMIN (GLUCOPHAGE-XR) 750 MG 24 hr tablet Take 2 tablets (1,500 mg total) by mouth daily with breakfast.  180 tablet  3  . [DISCONTINUED] lisinopril-hydrochlorothiazide (PRINZIDE,ZESTORETIC) 20-25 MG per tablet TAKE 1 TABLET BY MOUTH ONCE DAILY  90 tablet  0  . [DISCONTINUED] metFORMIN (GLUCOPHAGE-XR) 750 MG 24 hr tablet Take 2 tablets (1,500 mg total) by mouth daily with breakfast.  180 tablet  0   No facility-administered encounter medications on file as of 06/04/2013.    EXAM:  BP 130/84  Pulse 62  Temp(Src) 98.3 F (36.8 C) (Oral)  Wt 154 lb (69.854 kg)  BMI 28.16 kg/m2  SpO2 98%  Body mass index is 28.16 kg/(m^2).  Physical Exam: Vital signs reviewed RUE:AVWU is a  well-developed well-nourished alert cooperative   female who appears her stated age in no acute distress.  HEENT: normocephalic atraumatic , Eyes: PERRL EOM's full, conjunctiva clear, Nares: paten,t no deformity discharge or tenderness., Ears: no deformity EAC's clear TMs with normal landmarks. Mouth: clear OP, no lesions, edema.  Moist mucous membranes. Dentition in adequate repair. NECK: supple without masses,  Bruits. Thyroid palpable  CHEST/PULM:  Clear to auscultation and percussion breath sounds equal no wheeze , rales or rhonchi. No chest wall deformities or tenderness. CV: PMI is nondisplaced, S1 S2 no gallops, murmurs, rubs. Peripheral pulses are full without delay.No JVD .  Breast: normal by inspection . No dimpling, discharge, masses, tenderness or  discharge . ABDOMEN: Bowel sounds normal nontender  No guard or rebound, no hepato splenomegal no CVA tenderness.  No hernia. Extremtities:  No clubbing cyanosis or edema, no acute joint swelling or redness no focal atrophy NEURO:  Oriented x3, cranial nerves 3-12 appear to be intact, no obvious focal weakness,gait within normal limits no abnormal reflexes or asymmetrical SKIN: No acute rashes normal turgor, color, no bruising or petechiae. PSYCH: Oriented, good eye contact, no obvious depression anxiety, cognition and judgment appear normal. LN: no cervical axillary inguinal adenopathy  Lab Results  Component Value Date   WBC 8.1 05/21/2013   HGB 13.5 05/21/2013   HCT 38.8 05/21/2013   PLT 209.0 05/21/2013   GLUCOSE 128* 05/21/2013   CHOL 184 05/21/2013   TRIG 273.0* 05/21/2013   HDL 49.70 05/21/2013   LDLDIRECT 93.6 05/21/2013   LDLCALC 95 02/25/2012   ALT 37* 05/21/2013   AST 30 05/21/2013   NA 136 05/21/2013   K 3.8 05/21/2013   CL 99 05/21/2013   CREATININE 0.8 05/21/2013   BUN 12 05/21/2013   CO2 28 05/21/2013   TSH 1.94 05/21/2013   INR 1.03 09/12/2011   HGBA1C 6.5 05/21/2013   MICROALBUR 8.4* 05/21/2013    ASSESSMENT AND PLAN:  Discussed the following assessment and plan:  Visit for preventive health examination  Type II or unspecified type diabetes mellitus without mention of complication, not stated as uncontrolled - ate 830 this am before labsdecline pneumovax today  - Plan: metFORMIN (GLUCOPHAGE-XR) 750 MG 24 hr tablet  Hypertension - Plan: metFORMIN (GLUCOPHAGE-XR) 750 MG 24 hr tablet  Hyperlipidemia - Plan: metFORMIN (GLUCOPHAGE-XR) 750 MG 24 hr tablet  Hepatic steatosis - ? lasat [place]  had neg hep b and c serology last year; had twin rix . get ultrasound  - Plan: metFORMIN (GLUCOPHAGE-XR) 750 MG 24 hr tablet  Abnormal LFTs - Plan: metFORMIN (GLUCOPHAGE-XR) 750 MG 24 hr tablet  Need for prophylactic vaccination with combined diphtheria-tetanus-pertussis (DTP) vaccine - Plan: Tdap vaccine greater  than or equal to 7yo IM  Diarrhea - Could be loose stools side effect of medicine altered how you take the metformin if persistent can get GI consult Risk benefit of screening discussed disease states discussed. Overall diabetes is better liver tests are better uncertain cause of her loose stools but could be medication agree with weight loss. Patient Care Team: Madelin Headings, MD as PCP - General (Internal Medicine)  Health Maintenance  Topic Date Due  . Pneumococcal Polysaccharide Vaccine (#1) 02/08/1968  . Foot Exam  02/08/1976  . Ophthalmology Exam  02/08/1976  . Influenza Vaccine  08/20/2013  . Pap Smear  02/10/2014  . Urine Microalbumin  05/21/2014  . Tetanus/tdap  06/05/2023   Health Maintenance Review   Patient Instructions  bp goal is below 140/90  Can take bp medication at night   And if still elevated we may adjsut your medication. agree with going to nutritionist to help with further  life style intervention. Healthy weigt loss may help blood pressure and blood sugars and triglycerides . Contact  us when you want to get the pneumococcal vaccine  Tetanus booster  vaccine today.  Get a mammogram . If you wish  At breast center   Or  When 50 years.  Get an eye exam from opthalmology .  Take  Metformin one in am ( 750) and one in pm .  If getting   Diarrhea continuing  After  Changing for a month then  Contact us for a GI consult . Liver tests are a lot better .  Labs and ROV in 4-6 months or as needed   How to Avoid Diabetes Problems You can do a lot to prevent or slow down diabetes problems. Following your diabetes plan and taking care of yourself can reduce your risk of serious or life-threatening complications. Below, you will find certain things you can do to prevent diabetes problems. MANAGE YOUR DIABETES Follow your caregiver's, nurse educator's, and dietitian's instructions for managing your diabetes. They will teach you the basics of diabetes care. They can help  answer questions you may have. Learn about diabetes and make healthy choices regarding eating and physical activity. Monitor your blood glucose level regularly. Your caregiver will help you decide how often to check your blood glucose level depending on your treatment goals and how well you are meeting them.  DO NOT SMOKE Smoking and diabetes are a dangerous combination. Smoking raises your risk for diabetes problems. If you quit smoking, you will lower your risk for heart attack, stroke, nerve disease, and kidney disease. Your cholesterol and your blood pressure levels may improve. Your blood circulation will also improve. If you smoke, ask your caregiver for help in quitting. KEEP YOUR BLOOD PRESSURE UNDER CONTROL Keeping your blood pressure under control will help prevent damage to your eyes, kidneys, heart, and blood vessels. Blood pressure consists of two numbers. The top number should be below 120, and the bottom number should be below 80 (120/80). Keep your blood pressure as close to these numbers as you can. If you already have kidney disease, you may want even lower blood pressure to protect your kidneys. Talk to your caregiver to make sure that your blood pressure goal is right for your needs. Meal planning, medicines, and exercise can help you reach your blood pressure target. Have your blood pressure checked at every visit with your caregiver. KEEP YOUR CHOLESTEROL UNDER CONTROL Normal cholesterol levels will help prevent heart disease and stroke. These are the biggest health problems for people with diabetes. Keeping cholesterol levels under control can also help with blood flow. Have your cholesterol level checked at least once a year. Meal planning, exercise, and medicines can help you reach your cholesterol targets. SCHEDULE AND KEEP YOUR ANNUAL PHYSICAL EXAMS AND EYE EXAMS Your caregiver will tell you how often he or she wants to see you depending on your plan of treatment. It is important  that you keep these appointments so that possible problems can be identified early and complications can be avoided or treated.  Every visit with your caregiver should include your weight, blood pressure, and an evaluation of your blood glucose control.  Your hemoglobin A1c should be checked:  At least twice a year if you are at your goal.  Every 3 months  if there are changes in treatment.  If you are not meeting your goals.  Your blood lipids should be checked yearly. You should also be checked yearly to see if you have protein in your urine (microalbumin).  Schedule a dilated eye exam if you have type 1 diabetes within 5 years of your diagnosis and then yearly. Schedule a dilated eye exam if you have type 2 diabetes at diagnosis and then yearly. All exams thereafter can be extended to every 2 to 3 years if one or more exams have been normal. KEEP YOUR VACCINES CURRENT The flu vaccine is recommended yearly. The formula for the vaccine changes every year and needs to be updated for the best protection against current viruses. In addition, you should get a vaccination against pneumonia at least once in your life. However, there are some instances where another vaccine is recommended. Check with your caregiver. TAKE CARE OF YOUR FEET  Diabetes may cause you to have a poor blood supply (circulation) to your legs and feet. Because of this, the skin may be thinner, break easier, and heal more slowly. You also may have nerve damage in your legs and feet causing decreased feeling. You may not notice minor injuries to your feet that could lead to serious problems or infections. Taking care of your feet is very important. Visual foot exams are performed at every routine medical visit. The exams check for cuts, injuries, or other problems with the feet. A comprehensive foot exam should be done yearly. This includes visual inspection as well as assessing foot pulses and testing for loss of sensation. You  should also do the following:  Inspect your feet daily for cuts, calluses, blisters, ingrown toenails, and signs of infection, such as redness, swelling, or pus.  Wash and dry your feet thoroughly, especially between the toes.  Avoid soaking your feet regularly in hot water baths.  Moisturize dry skin with lotion, avoiding areas between your toes.  Cut toenails straight across and file the edges.  Avoid shoes that do not fit well or have areas that irritate your skin.  Avoid going barefooted or wearing only socks. Your feet need protection. TAKE CARE OF YOUR TEETH People with poorly controlled diabetes are more likely to have gum (periodontal) disease. These infections make diabetes harder to control. Periodontal diseases, if left untreated, can lead to tooth loss. Brush your teeth twice a day, floss, and see your dentist for checkups and cleaning every 6 months, or 2 times a year. ASK YOUR CAREGIVER ABOUT TAKING ASPIRIN Taking aspirin daily is recommended to help prevent cardiovascular disease in people with and without diabetes. Ask your caregiver if this would benefit you and what dose he or she would recommend. DRINK RESPONSIBLY Moderate amounts of alcohol (less than 1 drink per day for adult women and less than 2 drinks per day for adult men) have a minimal effect on blood glucose if ingested with food. It is important to eat food with alcohol to avoid hypoglycemia. People should avoid alcohol if they have a history of alcohol abuse or dependence, if they are pregnant, and if they have liver disease, pancreatitis, advanced neuropathy, or severe hypertriglyceridemia. LESSEN STRESS Living with diabetes can be stressful. When you are under stress, your blood glucose may be affected in two ways:  Stress hormones may cause your blood glucose to rise.  You may be distracted from taking good care of yourself. It is a good idea to be aware of your stress level and  make changes that are  necessary to help you better manage challenging situations. Support groups, planned relaxation, a hobby you enjoy, meditation, healthy relationships, and exercise all work to lower your stress level. If your efforts do not seem to be helping, get help from your caregiver or a trained mental health professional. Document Released: 08/24/2011 Document Revised: 11/22/2012 Document Reviewed: 08/24/2011 [facility] Patient Information 2014 [place], [place].    Neta Mends. Panosh M.D.

## 2013-06-04 NOTE — Patient Instructions (Addendum)
bp goal is below 140/90   Can take bp medication at night   And if still elevated we may adjsut your medication. agree with going to nutritionist to help with further  life style intervention. Healthy weigt loss may help blood pressure and blood sugars and triglycerides . Contact  us when you want to get the pneumococcal vaccine  Tetanus booster  vaccine today.  Get a mammogram . If you wish  At breast center   Or  When 50 years.  Get an eye exam from opthalmology .  Take  Metformin one in am ( 750) and one in pm .  If getting   Diarrhea continuing  After  Changing for a month then  Contact us for a GI consult . Liver tests are a lot better .  Labs and ROV in 4-6 months or as needed   How to Avoid Diabetes Problems You can do a lot to prevent or slow down diabetes problems. Following your diabetes plan and taking care of yourself can reduce your risk of serious or life-threatening complications. Below, you will find certain things you can do to prevent diabetes problems. MANAGE YOUR DIABETES Follow your caregiver's, nurse educator's, and dietitian's instructions for managing your diabetes. They will teach you the basics of diabetes care. They can help answer questions you may have. Learn about diabetes and make healthy choices regarding eating and physical activity. Monitor your blood glucose level regularly. Your caregiver will help you decide how often to check your blood glucose level depending on your treatment goals and how well you are meeting them.  DO NOT SMOKE Smoking and diabetes are a dangerous combination. Smoking raises your risk for diabetes problems. If you quit smoking, you will lower your risk for heart attack, stroke, nerve disease, and kidney disease. Your cholesterol and your blood pressure levels may improve. Your blood circulation will also improve. If you smoke, ask your caregiver for help in quitting. KEEP YOUR BLOOD PRESSURE UNDER CONTROL Keeping your blood pressure under  control will help prevent damage to your eyes, kidneys, heart, and blood vessels. Blood pressure consists of two numbers. The top number should be below 120, and the bottom number should be below 80 (120/80). Keep your blood pressure as close to these numbers as you can. If you already have kidney disease, you may want even lower blood pressure to protect your kidneys. Talk to your caregiver to make sure that your blood pressure goal is right for your needs. Meal planning, medicines, and exercise can help you reach your blood pressure target. Have your blood pressure checked at every visit with your caregiver. KEEP YOUR CHOLESTEROL UNDER CONTROL Normal cholesterol levels will help prevent heart disease and stroke. These are the biggest health problems for people with diabetes. Keeping cholesterol levels under control can also help with blood flow. Have your cholesterol level checked at least once a year. Meal planning, exercise, and medicines can help you reach your cholesterol targets. SCHEDULE AND KEEP YOUR ANNUAL PHYSICAL EXAMS AND EYE EXAMS Your caregiver will tell you how often he or she wants to see you depending on your plan of treatment. It is important that you keep these appointments so that possible problems can be identified early and complications can be avoided or treated.  Every visit with your caregiver should include your weight, blood pressure, and an evaluation of your blood glucose control.  Your hemoglobin A1c should be checked:  At least twice a year if you are at  your goal.  Every 3 months if there are changes in treatment.  If you are not meeting your goals.  Your blood lipids should be checked yearly. You should also be checked yearly to see if you have protein in your urine (microalbumin).  Schedule a dilated eye exam if you have type 1 diabetes within 5 years of your diagnosis and then yearly. Schedule a dilated eye exam if you have type 2 diabetes at diagnosis and then  yearly. All exams thereafter can be extended to every 2 to 3 years if one or more exams have been normal. KEEP YOUR VACCINES CURRENT The flu vaccine is recommended yearly. The formula for the vaccine changes every year and needs to be updated for the best protection against current viruses. In addition, you should get a vaccination against pneumonia at least once in your life. However, there are some instances where another vaccine is recommended. Check with your caregiver. TAKE CARE OF YOUR FEET  Diabetes may cause you to have a poor blood supply (circulation) to your legs and feet. Because of this, the skin may be thinner, break easier, and heal more slowly. You also may have nerve damage in your legs and feet causing decreased feeling. You may not notice minor injuries to your feet that could lead to serious problems or infections. Taking care of your feet is very important. Visual foot exams are performed at every routine medical visit. The exams check for cuts, injuries, or other problems with the feet. A comprehensive foot exam should be done yearly. This includes visual inspection as well as assessing foot pulses and testing for loss of sensation. You should also do the following:  Inspect your feet daily for cuts, calluses, blisters, ingrown toenails, and signs of infection, such as redness, swelling, or pus.  Wash and dry your feet thoroughly, especially between the toes.  Avoid soaking your feet regularly in hot water baths.  Moisturize dry skin with lotion, avoiding areas between your toes.  Cut toenails straight across and file the edges.  Avoid shoes that do not fit well or have areas that irritate your skin.  Avoid going barefooted or wearing only socks. Your feet need protection. TAKE CARE OF YOUR TEETH People with poorly controlled diabetes are more likely to have gum (periodontal) disease. These infections make diabetes harder to control. Periodontal diseases, if left untreated,  can lead to tooth loss. Brush your teeth twice a day, floss, and see your dentist for checkups and cleaning every 6 months, or 2 times a year. ASK YOUR CAREGIVER ABOUT TAKING ASPIRIN Taking aspirin daily is recommended to help prevent cardiovascular disease in people with and without diabetes. Ask your caregiver if this would benefit you and what dose he or she would recommend. DRINK RESPONSIBLY Moderate amounts of alcohol (less than 1 drink per day for adult women and less than 2 drinks per day for adult men) have a minimal effect on blood glucose if ingested with food. It is important to eat food with alcohol to avoid hypoglycemia. People should avoid alcohol if they have a history of alcohol abuse or dependence, if they are pregnant, and if they have liver disease, pancreatitis, advanced neuropathy, or severe hypertriglyceridemia. LESSEN STRESS Living with diabetes can be stressful. When you are under stress, your blood glucose may be affected in two ways:  Stress hormones may cause your blood glucose to rise.  You may be distracted from taking good care of yourself. It is a good idea to  be aware of your stress level and make changes that are necessary to help you better manage challenging situations. Support groups, planned relaxation, a hobby you enjoy, meditation, healthy relationships, and exercise all work to lower your stress level. If your efforts do not seem to be helping, get help from your caregiver or a trained mental health professional. Document Released: 08/24/2011 Document Revised: 11/22/2012 Document Reviewed: 08/24/2011 [facility] Patient Information 2014 [place], [place].

## 2013-06-08 DIAGNOSIS — Z0000 Encounter for general adult medical examination without abnormal findings: Secondary | ICD-10-CM | POA: Insufficient documentation

## 2013-06-26 ENCOUNTER — Other Ambulatory Visit: Payer: Self-pay | Admitting: Internal Medicine

## 2013-06-28 ENCOUNTER — Other Ambulatory Visit: Payer: Self-pay

## 2013-10-12 ENCOUNTER — Ambulatory Visit (INDEPENDENT_AMBULATORY_CARE_PROVIDER_SITE_OTHER): Payer: BC Managed Care – PPO

## 2013-10-12 DIAGNOSIS — Z23 Encounter for immunization: Secondary | ICD-10-CM

## 2013-11-21 ENCOUNTER — Other Ambulatory Visit (INDEPENDENT_AMBULATORY_CARE_PROVIDER_SITE_OTHER): Payer: BC Managed Care – PPO

## 2013-11-21 DIAGNOSIS — R7989 Other specified abnormal findings of blood chemistry: Secondary | ICD-10-CM

## 2013-11-21 DIAGNOSIS — E119 Type 2 diabetes mellitus without complications: Secondary | ICD-10-CM

## 2013-11-21 LAB — HEMOGLOBIN A1C: Hgb A1c MFr Bld: 6.4 % (ref 4.6–6.5)

## 2013-11-21 LAB — HEPATIC FUNCTION PANEL
ALT: 41 U/L — ABNORMAL HIGH (ref 0–35)
Total Bilirubin: 0.9 mg/dL (ref 0.3–1.2)
Total Protein: 7.3 g/dL (ref 6.0–8.3)

## 2013-11-26 ENCOUNTER — Ambulatory Visit (INDEPENDENT_AMBULATORY_CARE_PROVIDER_SITE_OTHER): Payer: BC Managed Care – PPO | Admitting: Internal Medicine

## 2013-11-26 ENCOUNTER — Encounter: Payer: Self-pay | Admitting: Internal Medicine

## 2013-11-26 VITALS — BP 126/82 | Temp 98.0°F | Wt 152.0 lb

## 2013-11-26 DIAGNOSIS — K7689 Other specified diseases of liver: Secondary | ICD-10-CM

## 2013-11-26 DIAGNOSIS — E282 Polycystic ovarian syndrome: Secondary | ICD-10-CM

## 2013-11-26 DIAGNOSIS — E119 Type 2 diabetes mellitus without complications: Secondary | ICD-10-CM

## 2013-11-26 DIAGNOSIS — K760 Fatty (change of) liver, not elsewhere classified: Secondary | ICD-10-CM

## 2013-11-26 DIAGNOSIS — I10 Essential (primary) hypertension: Secondary | ICD-10-CM

## 2013-11-26 NOTE — Progress Notes (Signed)
Chief Complaint  Patient presents with  . Follow-up    HPI: Patient comes in today for follow up of  multiple medical problems.   DM ok no new symptoms has questions has lost some weight trying to do   BP :  Good   loose stools resolved with  bvoitamins and b12 supplements and doing better now   Thinks her skin is tenting becsause pof diuretic  On legs   bp good  ? Not enough water  Does salt food .  Not a lot of beverages.   declines prevnar had sore arm and felt bad after flu vaccine   Didn't get abdominal ultrasound states that her liver issue was probably fatty liver and evaluated fully in [place]. ROS: See pertinent positives and negatives per HPI.  Past Medical History  Diagnosis Date  . Diabetes mellitus   . Hyperlipidemia   . Hypertension   . Anemia     Family History  Problem Relation Age of Onset  . Hypertension Mother   . Diabetes Father   . Diabetes Other     Grandparent  . Stroke Other     Grandparent  . Hyperlipidemia      parents  . Developmental delay Daughter     History   Social History  . Marital Status: Married    Spouse Name: N/A    Number of Children: 1  . Years of Education: BS   Occupational History  . Acupuncturist    Social History Main Topics  . Smoking status: Never Smoker   . Smokeless tobacco: None  . Alcohol Use: No  . Drug Use: No  . Sexual Activity: Yes    Birth Control/ Protection: Pill   Other Topics Concern  . 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     Outpatient Encounter Prescriptions as of 11/26/2013  Medication Sig  . glucose blood test strip Use bid  . Lancets Thin MISC Use bid- pt needs lancets that goes with the contour machine  . lisinopril-hydrochlorothiazide (PRINZIDE,ZESTORETIC) 20-25 MG per tablet TAKE 1 TABLET BY MOUTH ONCE DAILY  . metFORMIN  (GLUCOPHAGE-XR) 750 MG 24 hr tablet Take 2 tablets (1,500 mg total) by mouth daily with breakfast.    EXAM:  BP 126/82  Temp(Src) 98 F (36.7 C) (Oral)  Wt 152 lb (68.947 kg)  Body mass index is 27.79 kg/(m^2).  GENERAL: vitals reviewed and listed above, alert, oriented, appears well hydrated and in no acute distress  HEENT: atraumatic, conjunctiva  clear, no obvious abnormalities on inspection of external nose and ears  NECK: no obvious masses on inspection palpation  LUNGS: clear to auscultation bilaterally, no wheezes, rales or rhonchi, good air movement CV: HRRR, no clubbing cyanosis or  peripheral edema nl cap refill  MS: moves all extremities without noticeable focal  abnormality Abdomen soft without organomegaly guarding or rebound PSYCH: pleasant and cooperative, no obvious depression or anxiety Lab Results  Component Value Date   WBC 8.1 05/21/2013   HGB 13.5 05/21/2013   HCT 38.8 05/21/2013   PLT 209.0 05/21/2013   GLUCOSE 128* 05/21/2013   CHOL 184 05/21/2013   TRIG 273.0* 05/21/2013   HDL 49.70 05/21/2013   LDLDIRECT 93.6 05/21/2013   LDLCALC 95 02/25/2012   ALT 41* 11/21/2013  AST 28 11/21/2013   NA 136 05/21/2013   K 3.8 05/21/2013   CL 99 05/21/2013   CREATININE 0.8 05/21/2013   BUN 12 05/21/2013   CO2 28 05/21/2013   TSH 1.94 05/21/2013   INR 1.03 09/12/2011   HGBA1C 6.4 11/21/2013   MICROALBUR 8.4* 05/21/2013    ASSESSMENT AND PLAN:  Discussed the following assessment and plan:  Type II or unspecified type diabetes mellitus without mention of complication, not stated as uncontrolled  PCOS (polycystic ovarian syndrome)  Hepatic steatosis  Hypertension  -Patient advised to return or notify health care team  if symptoms worsen or persist or new concerns arise.  Patient Instructions   Continue lifestyle intervention healthy eating and exercise . Increase   Water intake .  Limit sodium. and salt  Try getting more sleep .  8 hours is better than 6 .   Advise pneumonia vaccine.   When you decide  Prevnar 13 first.   Wt Readings from Last 3 Encounters:  11/26/13 152 lb (68.947 kg)  06/04/13 154 lb (69.854 kg)  03/12/13 156 lb (70.761 kg)   6 months wellness with  cpx labs and hg a1c test   Neta Mends. Panosh M.D.  Pre visit review using our clinic review tool, if applicable. No additional management support is needed unless otherwise documented below in the visit note. Total visit > 50% spent counseling and coordinating care

## 2013-11-26 NOTE — Patient Instructions (Signed)
Continue lifestyle intervention healthy eating and exercise . Increase   Water intake .  Limit sodium. and salt  Try getting more sleep .  8 hours is better than 6 .   Advise pneumonia vaccine.  When you decide  Prevnar 13 first.   Wt Readings from Last 3 Encounters:  11/26/13 152 lb (68.947 kg)  06/04/13 154 lb (69.854 kg)  03/12/13 156 lb (70.761 kg)   6 months wellness with  cpx labs and hg a1c test

## 2013-11-28 ENCOUNTER — Other Ambulatory Visit: Payer: BC Managed Care – PPO

## 2013-12-05 ENCOUNTER — Ambulatory Visit: Payer: BC Managed Care – PPO | Admitting: Internal Medicine

## 2014-05-21 ENCOUNTER — Other Ambulatory Visit: Payer: Self-pay | Admitting: Internal Medicine

## 2014-05-21 NOTE — Telephone Encounter (Signed)
Sent to the pharmacy by e-scribe.  Pt has upcoming CPE. 

## 2014-05-21 NOTE — Telephone Encounter (Signed)
Sent to the pharmacy by e-scribe. 

## 2014-06-19 ENCOUNTER — Other Ambulatory Visit (INDEPENDENT_AMBULATORY_CARE_PROVIDER_SITE_OTHER): Payer: BC Managed Care – PPO

## 2014-06-19 ENCOUNTER — Other Ambulatory Visit: Payer: Self-pay | Admitting: Internal Medicine

## 2014-06-19 DIAGNOSIS — Z0000 Encounter for general adult medical examination without abnormal findings: Secondary | ICD-10-CM

## 2014-06-19 LAB — LIPID PANEL
Cholesterol: 228 mg/dL — ABNORMAL HIGH (ref 0–200)
HDL: 61.1 mg/dL
LDL Cholesterol: 102 mg/dL — ABNORMAL HIGH (ref 0–99)
NonHDL: 166.9
Total CHOL/HDL Ratio: 4
Triglycerides: 323 mg/dL — ABNORMAL HIGH (ref 0.0–149.0)
VLDL: 64.6 mg/dL — ABNORMAL HIGH (ref 0.0–40.0)

## 2014-06-19 LAB — MICROALBUMIN / CREATININE URINE RATIO
Creatinine,U: 177 mg/dL
Microalb Creat Ratio: 102.2 mg/g — ABNORMAL HIGH (ref 0.0–30.0)

## 2014-06-19 LAB — HEPATIC FUNCTION PANEL
ALT: 55 U/L — ABNORMAL HIGH (ref 0–35)
AST: 42 U/L — ABNORMAL HIGH (ref 0–37)
Albumin: 4 g/dL (ref 3.5–5.2)
Alkaline Phosphatase: 46 U/L (ref 39–117)
BILIRUBIN DIRECT: 0 mg/dL (ref 0.0–0.3)
BILIRUBIN TOTAL: 0.5 mg/dL (ref 0.2–1.2)
Total Protein: 7.2 g/dL (ref 6.0–8.3)

## 2014-06-19 LAB — CBC WITH DIFFERENTIAL/PLATELET
BASOS ABS: 0 10*3/uL (ref 0.0–0.1)
Basophils Relative: 0.4 % (ref 0.0–3.0)
EOS ABS: 0.1 10*3/uL (ref 0.0–0.7)
EOS PCT: 1.8 % (ref 0.0–5.0)
HEMATOCRIT: 38.3 % (ref 36.0–46.0)
Hemoglobin: 13 g/dL (ref 12.0–15.0)
LYMPHS ABS: 2.6 10*3/uL (ref 0.7–4.0)
Lymphocytes Relative: 37.7 % (ref 12.0–46.0)
MCHC: 33.8 g/dL (ref 30.0–36.0)
MCV: 91.4 fl (ref 78.0–100.0)
MONO ABS: 0.4 10*3/uL (ref 0.1–1.0)
Monocytes Relative: 5.9 % (ref 3.0–12.0)
NEUTROS PCT: 54.2 % (ref 43.0–77.0)
Neutro Abs: 3.7 10*3/uL (ref 1.4–7.7)
PLATELETS: 226 10*3/uL (ref 150.0–400.0)
RBC: 4.2 Mil/uL (ref 3.87–5.11)
RDW: 14 % (ref 11.5–15.5)
WBC: 6.9 10*3/uL (ref 4.0–10.5)

## 2014-06-19 LAB — BASIC METABOLIC PANEL
BUN: 9 mg/dL (ref 6–23)
CHLORIDE: 102 meq/L (ref 96–112)
CO2: 27 mEq/L (ref 19–32)
CREATININE: 0.7 mg/dL (ref 0.4–1.2)
Calcium: 9.5 mg/dL (ref 8.4–10.5)
GFR: 98 mL/min (ref >60.00)
GLUCOSE: 154 mg/dL — AB (ref 70–99)
Potassium: 3.6 mEq/L (ref 3.5–5.1)
Sodium: 140 mEq/L (ref 135–145)

## 2014-06-19 LAB — HEMOGLOBIN A1C: HEMOGLOBIN A1C: 6.9 % — AB (ref 4.6–6.5)

## 2014-06-19 LAB — TSH: TSH: 2.5 u[IU]/mL (ref 0.35–4.50)

## 2014-06-19 NOTE — Telephone Encounter (Signed)
Sent by e-scribe. 

## 2014-06-24 ENCOUNTER — Ambulatory Visit (INDEPENDENT_AMBULATORY_CARE_PROVIDER_SITE_OTHER): Payer: BC Managed Care – PPO | Admitting: Internal Medicine

## 2014-06-24 ENCOUNTER — Encounter: Payer: Self-pay | Admitting: Internal Medicine

## 2014-06-24 VITALS — BP 126/82 | HR 72 | Temp 99.4°F | Ht 63.0 in | Wt 153.0 lb

## 2014-06-24 DIAGNOSIS — R809 Proteinuria, unspecified: Secondary | ICD-10-CM

## 2014-06-24 DIAGNOSIS — E785 Hyperlipidemia, unspecified: Secondary | ICD-10-CM

## 2014-06-24 DIAGNOSIS — K7689 Other specified diseases of liver: Secondary | ICD-10-CM

## 2014-06-24 DIAGNOSIS — I10 Essential (primary) hypertension: Secondary | ICD-10-CM

## 2014-06-24 DIAGNOSIS — Z0000 Encounter for general adult medical examination without abnormal findings: Secondary | ICD-10-CM

## 2014-06-24 DIAGNOSIS — E119 Type 2 diabetes mellitus without complications: Secondary | ICD-10-CM

## 2014-06-24 DIAGNOSIS — K760 Fatty (change of) liver, not elsewhere classified: Secondary | ICD-10-CM

## 2014-06-24 DIAGNOSIS — Z87898 Personal history of other specified conditions: Secondary | ICD-10-CM | POA: Insufficient documentation

## 2014-06-24 DIAGNOSIS — E282 Polycystic ovarian syndrome: Secondary | ICD-10-CM

## 2014-06-24 NOTE — Progress Notes (Signed)
Pre visit review using our clinic review tool, if applicable. No additional management support is needed unless otherwise documented below in the visit note.  Chief Complaint  Patient presents with  . Annual Exam    had to lose weight  . Hypertension  . Diabetes    HPI: Patient comes in today for Preventive Health Care visit  Since last visit  Has been trying to do well with diet exercise  Walking  Every day  Still gaining  Eats proper.  Just gets hungry . No lows  but having a bit of the time getting her sugars right No periods from ablation last pap 2 yearago has gyne  Had episode of sever cp mid upper left  And relieved with taking moms NTG x 4  In January no recurrence but felt she had a heart attack ioor at least  Heart pain . Did not seek medical care at that time has not had a reoccurrence. Think she has heart disease. No palpitations or swelling of lower extremities.  Health Maintenance  Topic Date Due  . Pneumococcal Polysaccharide Vaccine (##1) 02/08/1968  . Foot Exam  02/08/1976  . Ophthalmology Exam  02/08/1976  . Influenza Vaccine  07/20/2014  . Hemoglobin A1c  12/20/2014  . Urine Microalbumin  06/20/2015  . Pap Smear  07/21/2015  . Tetanus/tdap  06/05/2023   Health Maintenance Review LIFESTYLE:  Exercise:   yest  Miles  Tobacco/ETS: Alcohol: per day  None  Sugar beverages:no Sleep: well  Drug use: no  ROS:  GEN/ HEENT: No fever, significant weight changes sweats headaches vision problems hearing changes, CV/ PULM; No  shortness of breath cough, syncope,edema  change in exercise tolerance. GI /GU: No adominal pain, vomiting, change in bowel habits. No blood in the stool. No significant GU symptoms. SKIN/HEME: ,no acute skin rashes suspicious lesions or bleeding. No lymphadenopathy, nodules, masses.  NEURO/ PSYCH:  No neurologic signs such as weakness numbness. No depression anxiety. IMM/ Allergy: No unusual infections.  Allergy .   REST of 12 system review  negative except as per HPI   Past Medical History  Diagnosis Date  . Diabetes mellitus   . Hyperlipidemia   . Hypertension   . Anemia     Family History  Problem Relation Age of Onset  . Hypertension Mother   . Diabetes Father   . Diabetes Other     Grandparent  . Stroke Other     Grandparent  . Hyperlipidemia      parents  . Developmental delay Daughter     History   Social History  . Marital Status: Married    Spouse Name: N/A    Number of Children: 1  . Years of Education: BS   Occupational History  . Acupuncturist    Social History Main Topics  . Smoking status: Never Smoker   . Smokeless tobacco: None  . Alcohol Use: No  . Drug Use: No  . Sexual Activity: Yes    Birth Control/ Protection: Pill   Other Topics Concern  . 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     Outpatient Encounter Prescriptions as of 06/24/2014  Medication Sig  . glucose blood  test strip Use bid  . Lancets Thin MISC Use bid- pt needs lancets that goes with the contour machine  . lisinopril-hydrochlorothiazide (PRINZIDE,ZESTORETIC) 20-25 MG per tablet TAKE 1 TABLET BY MOUTH ONCE DAILY  . metFORMIN (GLUCOPHAGE-XR) 750 MG 24 hr tablet TAKE 2 TABLETS BY MOUTH DAILY WITH BREAKFAST    EXAM:  BP 126/82  Pulse 72  Temp(Src) 99.4 F (37.4 C) (Oral)  Ht 5\' 3"  (1.6 m)  Wt 153 lb (69.4 kg)  BMI 27.11 kg/m2  SpO2 97%  Body mass index is 27.11 kg/(m^2).  Physical Exam: Vital signs reviewed [id number] is a well-developed well-nourished alert cooperative    who appearsr stated age in no acute distress. Central adiposity. HEENT: normocephalic atraumatic , Eyes: PERRL EOM's full, conjunctiva clear, Nares: paten,t no deformity discharge or tenderness., Ears: no deformity EAC's clear TMs  with normal landmarks. Mouth: clear OP, no lesions, edema.  Moist mucous membranes. Dentition in adequate repair. NECK: supple without masses, thyromegaly or bruits. CHEST/PULM:  Clear to auscultation and percussion breath sounds equal no wheeze , rales or rhonchi. No chest wall deformities or tenderness.Breast: normal by inspection . No dimpling, discharge, masses, tenderness or discharge . CV: PMI is nondisplaced, S1 S2 no gallops, murmurs, rubs. Peripheral pulses are full without delay.No JVD .  ABDOMEN: Bowel sounds normal nontender  No guard or rebound, no hepato splenomegal no CVA tenderness.  No hernia. Extremtities:  No clubbing cyanosis or edema, no acute joint swelling or redness no focal atrophy NEURO:  Oriented x3, cranial nerves 3-12 appear to be intact, no obvious focal weakness,gait within normal limits no abnormal reflexes or asymmetrical SKIN: No acute rashes normal turgor, color, no bruising or petechiae. PSYCH: Oriented, good eye contact, no obvious depression anxiety, cognition and judgment appear normal. LN: no cervical axillary inguinal adenopathy Diabetic foot exam neg  Lab Results  Component Value Date   WBC 6.9 06/19/2014   HGB 13.0 06/19/2014   HCT 38.3 06/19/2014   PLT 226.0 06/19/2014   GLUCOSE 154* 06/19/2014   CHOL 228* 06/19/2014   TRIG 323.0* 06/19/2014   HDL 61.10 06/19/2014   LDLDIRECT 93.6 05/21/2013   LDLCALC 102* 06/19/2014   ALT 55* 06/19/2014   AST 42* 06/19/2014   NA 140 06/19/2014   K 3.6 06/19/2014   CL 102 06/19/2014   CREATININE 0.7 06/19/2014   BUN 9 06/19/2014   CO2 27 06/19/2014   TSH 2.50 06/19/2014   INR 1.03 09/12/2011   HGBA1C 6.9* 06/19/2014   MICROALBUR 181.0 Repeated and verified X2.* 06/19/2014    ASSESSMENT AND PLAN:  Discussed the following assessment and plan:  Visit for preventive health examination - Plan: EKG 12-Lead, EKG 12-Lead  Type II or unspecified type diabetes mellitus without mention of complication, not stated as uncontrolled - mictoalb elevate   repeat  - Plan: Ambulatory referral to Cardiology, Ambulatory referral to Endocrinology  Hepatic steatosis  Hyperlipidemia - elevated tg on no meds  at this time  - Plan: EKG 12-Lead, Ambulatory referral to Endocrinology, EKG 12-Lead  Hx of chest pain at rest - Patient felt she had a heart attack responded to nitroglycerin never sought care - Plan: EKG 12-Lead, Ambulatory referral to Cardiology, EKG 12-Lead  Essential hypertension -  appears controlled - Plan: Ambulatory referral to Cardiology, EKG 12-Lead  Proteinuria - screen repeat today with UA  - Plan: POCT urinalysis dipstick, Microalbumin / creatinine urine ratio  PCOS (polycystic ovarian syndrome) - consdier other med intervention.  Statin briefly discussed but  not started today many other issues discussed. Disc prevnar and pneumovax want to delay today  Discussed other options about onto her metformin including GLP-1 agonists or other methods at avoid weight gain. Discussed options we'll do referral to endocrinology prefers Dr. Sharl MaKerr who sees her daughter. Her episode of chest pain is a bit alarming but she hasn't had any recurrences and nothing unusual on her EKG. Will plan followup depending on how these referrals go under repeat urine protein test.  Patient Care Team: Madelin HeadingsWanda K Syre Knerr, MD as PCP - General (Internal Medicine) Patient Instructions    We'll arrange referral to cardiology because of your episode of chest pain relieved by nitroglycerin.  We'll also arrange endocrinology referral additional medicines may be indicated it is also possible that PCO S./ insulin resistance syndrome is making it harder to  to lose weight. Consider  Advise check blood glucose  Twice a day fasting  Included and record for endocrinologost  Please get a routine eye exam. Plan repeat urine test for protein Advise pneumococcal vaccine Prevnar 13 and then Pneumovax 23  Get Pap when due with her OB/GYN. Wt Readings from Last 3 Encounters:    06/24/14 153 lb (69.4 kg)  11/26/13 152 lb (68.947 kg)  06/04/13 154 lb (69.854 kg)   BP Readings from Last 3 Encounters:  06/24/14 126/82  11/26/13 126/82  06/04/13 130/84   Diabetes and Exercise Exercising regularly is important. It is not just about losing weight. It has many health benefits, such as:  Improving your overall fitness, flexibility, and endurance.  Increasing your bone density.  Helping with weight control.  Decreasing your body fat.  Increasing your muscle strength.  Reducing stress and tension.  Improving your overall health. People with diabetes who exercise gain additional benefits because exercise:  Reduces appetite.  Improves the body's use of blood sugar (glucose).  Helps lower or control blood glucose.  Decreases blood pressure.  Helps control blood lipids (such as cholesterol and triglycerides).  Improves the body's use of the hormone insulin by:  Increasing the body's insulin sensitivity.  Reducing the body's insulin needs.  Decreases the risk for heart disease because exercising:  Lowers cholesterol and triglycerides levels.  Increases the levels of good cholesterol (such as high-density lipoproteins [HDL]) in the body.  Lowers blood glucose levels. YOUR ACTIVITY PLAN  Choose an activity that you enjoy and set realistic goals. Your health care provider or diabetes educator can help you make an activity plan that works for you. You can break activities into 2 or 3 sessions throughout the day. Doing so is as good as one long session. Exercise ideas include:  Taking the dog for a walk.  Taking the stairs instead of the elevator.  Dancing to your favorite song.  Doing your favorite exercise with a friend. RECOMMENDATIONS FOR EXERCISING WITH TYPE 1 OR TYPE 2 DIABETES   Check your blood glucose before exercising. If blood glucose levels are greater than 240 mg/dL, check for urine ketones. Do not exercise if ketones are  present.  Avoid injecting insulin into areas of the body that are going to be exercised. For example, avoid injecting insulin into:  The arms when playing tennis.  The legs when jogging.  Keep a record of:  Food intake before and after you exercise.  Expected peak times of insulin action.  Blood glucose levels before and after you exercise.  The type and amount of exercise you have done.  Review your records with your health care  provider. Your health care provider will help you to develop guidelines for adjusting food intake and insulin amounts before and after exercising.  If you take insulin or oral hypoglycemic agents, watch for signs and symptoms of hypoglycemia. They include:  Dizziness.  Shaking.  Sweating.  Chills.  Confusion.  Drink plenty of water while you exercise to prevent dehydration or heat stroke. Body water is lost during exercise and must be replaced.  Talk to your health care provider before starting an exercise program to make sure it is safe for you. Remember, almost any type of activity is better than none. Document Released: 02/26/2004 Document Revised: 08/08/2013 Document Reviewed: 05/15/2013 [facility] Patient Information 2015 [place], [place]. This information is not intended to replace advice given to you by your health care provider. Make sure you discuss any questions you have with your health care provider.     Neta MendsWanda K. Jeraldine Primeau M.D.

## 2014-06-24 NOTE — Patient Instructions (Addendum)
We'll arrange referral to cardiology because of your episode of chest pain relieved by nitroglycerin.  We'll also arrange endocrinology referral additional medicines may be indicated it is also possible that PCO S./ insulin resistance syndrome is making it harder to  to lose weight. Consider  Advise check blood glucose  Twice a day fasting  Included and record for endocrinologost  Please get a routine eye exam. Plan repeat urine test for protein Advise pneumococcal vaccine Prevnar 13 and then Pneumovax 23  Get Pap when due with her OB/GYN. Wt Readings from Last 3 Encounters:  06/24/14 153 lb (69.4 kg)  11/26/13 152 lb (68.947 kg)  06/04/13 154 lb (69.854 kg)   BP Readings from Last 3 Encounters:  06/24/14 126/82  11/26/13 126/82  06/04/13 130/84   Diabetes and Exercise Exercising regularly is important. It is not just about losing weight. It has many health benefits, such as:  Improving your overall fitness, flexibility, and endurance.  Increasing your bone density.  Helping with weight control.  Decreasing your body fat.  Increasing your muscle strength.  Reducing stress and tension.  Improving your overall health. People with diabetes who exercise gain additional benefits because exercise:  Reduces appetite.  Improves the body's use of blood sugar (glucose).  Helps lower or control blood glucose.  Decreases blood pressure.  Helps control blood lipids (such as cholesterol and triglycerides).  Improves the body's use of the hormone insulin by:  Increasing the body's insulin sensitivity.  Reducing the body's insulin needs.  Decreases the risk for heart disease because exercising:  Lowers cholesterol and triglycerides levels.  Increases the levels of good cholesterol (such as high-density lipoproteins [HDL]) in the body.  Lowers blood glucose levels. YOUR ACTIVITY PLAN  Choose an activity that you enjoy and set realistic goals. Your health care provider or  diabetes educator can help you make an activity plan that works for you. You can break activities into 2 or 3 sessions throughout the day. Doing so is as good as one long session. Exercise ideas include:  Taking the dog for a walk.  Taking the stairs instead of the elevator.  Dancing to your favorite song.  Doing your favorite exercise with a friend. RECOMMENDATIONS FOR EXERCISING WITH TYPE 1 OR TYPE 2 DIABETES   Check your blood glucose before exercising. If blood glucose levels are greater than 240 mg/dL, check for urine ketones. Do not exercise if ketones are present.  Avoid injecting insulin into areas of the body that are going to be exercised. For example, avoid injecting insulin into:  The arms when playing tennis.  The legs when jogging.  Keep a record of:  Food intake before and after you exercise.  Expected peak times of insulin action.  Blood glucose levels before and after you exercise.  The type and amount of exercise you have done.  Review your records with your health care provider. Your health care provider will help you to develop guidelines for adjusting food intake and insulin amounts before and after exercising.  If you take insulin or oral hypoglycemic agents, watch for signs and symptoms of hypoglycemia. They include:  Dizziness.  Shaking.  Sweating.  Chills.  Confusion.  Drink plenty of water while you exercise to prevent dehydration or heat stroke. Body water is lost during exercise and must be replaced.  Talk to your health care provider before starting an exercise program to make sure it is safe for you. Remember, almost any type of activity is better than  none. Document Released: 02/26/2004 Document Revised: 08/08/2013 Document Reviewed: 05/15/2013 [facility] Patient Information 2015 [place]. This information is not intended to replace advice given to you by your health care provider. Make sure you discuss any questions you have with  your health care provider.

## 2014-06-25 ENCOUNTER — Telehealth: Payer: Self-pay | Admitting: Internal Medicine

## 2014-06-25 NOTE — Telephone Encounter (Signed)
Relevant patient education assigned to patient using Emmi. ° °

## 2014-07-17 ENCOUNTER — Ambulatory Visit (INDEPENDENT_AMBULATORY_CARE_PROVIDER_SITE_OTHER): Payer: BC Managed Care – PPO | Admitting: Internal Medicine

## 2014-07-17 ENCOUNTER — Encounter: Payer: Self-pay | Admitting: Internal Medicine

## 2014-07-17 VITALS — BP 138/90 | HR 69 | Ht 62.0 in | Wt 153.6 lb

## 2014-07-17 DIAGNOSIS — E785 Hyperlipidemia, unspecified: Secondary | ICD-10-CM

## 2014-07-17 DIAGNOSIS — I10 Essential (primary) hypertension: Secondary | ICD-10-CM

## 2014-07-17 DIAGNOSIS — R079 Chest pain, unspecified: Secondary | ICD-10-CM

## 2014-07-17 DIAGNOSIS — E139 Other specified diabetes mellitus without complications: Secondary | ICD-10-CM

## 2014-07-17 DIAGNOSIS — E119 Type 2 diabetes mellitus without complications: Secondary | ICD-10-CM

## 2014-07-17 NOTE — Patient Instructions (Addendum)
Your physician has requested that you have an exercise tolerance test. For further information please visit [email or web address]. Please also follow instruction sheet, as given.  Your physician recommends that you schedule a follow-up appointment after your stress test  

## 2014-07-18 ENCOUNTER — Telehealth: Payer: Self-pay | Admitting: *Deleted

## 2014-07-18 ENCOUNTER — Encounter: Payer: Self-pay | Admitting: Internal Medicine

## 2014-07-18 ENCOUNTER — Telehealth (HOSPITAL_COMMUNITY): Payer: Self-pay | Admitting: *Deleted

## 2014-07-18 DIAGNOSIS — E785 Hyperlipidemia, unspecified: Secondary | ICD-10-CM

## 2014-07-18 DIAGNOSIS — E119 Type 2 diabetes mellitus without complications: Secondary | ICD-10-CM

## 2014-07-18 DIAGNOSIS — R079 Chest pain, unspecified: Secondary | ICD-10-CM | POA: Insufficient documentation

## 2014-07-18 NOTE — Telephone Encounter (Signed)
Left message on machine for patient to call back and schedule a lab appointment to follow up with cholesterol Lipid panel ordered Message sent to my chart Diabetic bundle

## 2014-07-18 NOTE — Progress Notes (Signed)
OFFICE NOTE  Chief Complaint:  Chest/neck pain  Primary Care Physician: Amber HarpPANOSH,WANDA KOTVAN, MD  HPI:  Amber [name] is a 48 year old Asian female who resents today for evaluation of chest/neck pain. She reports pain that started in her right shoulder and one across the back of her neck her left shoulder and left anterior chest. This episode caused her to lay down and she felt then some heaviness in the center of her chest. She asked her husband to apparently pushed on the center of her chest or stand on her chest she said that that actually made her chest pain a little better. When she had her last episode several months ago she took 4 of her mothers heart pills. Apparently these are nitroglycerin with Cong[place] herbs. She took them all wants and it was recommended he take between 3 and 7 pills at a time based on the bottle. She apparently had complete relief in her chest discomfort symptoms as well as leg pain which she was feeling that totally one way. She does report fatigue and episodes of diarrhea that happened fairly regularly but could be related to metformin or perhaps her Dyazide. There is a family history of heart disease.  PMHx:  Past Medical History  Diagnosis Date  . Diabetes mellitus   . Hyperlipidemia   . Hypertension   . Anemia     Past Surgical History  Procedure Laterality Date  . No past surgeries    . Dilation and curettage of uterus  09/2011  . Blood trans  08/2011  . Ablastion  09/2011    FAMHx:  Family History  Problem Relation Age of Onset  . Hypertension Mother   . Diabetes Father   . Diabetes Other     Grandparent  . Stroke Other     Grandparent  . Hyperlipidemia      parents  . Developmental delay Daughter     SOCHx:   reports that she has never smoked. She does not have any smokeless tobacco history on file. She reports that she does not drink alcohol or use illicit drugs.  ALLERGIES:  Allergies  Allergen Reactions  . Actos [Pioglitazone]     Weight gain  . Other Rash    Flu vaccine    ROS: A comprehensive review of systems was negative except for: Constitutional: positive for fatigue Cardiovascular: positive for chest pain Gastrointestinal: positive for diarrhea and weakness  HOME MEDS: Current Outpatient Prescriptions  Medication Sig Dispense Refill  . Coenzyme Q10 (CO Q 10 PO) Take by mouth. Occasionally      . Cyanocobalamin (VITAMIN B 12 PO) Take by mouth daily.      Marland Kitchen. glucose blood test strip Use bid  100 each  12  . Lancets Thin MISC Use bid- pt needs lancets that goes with the contour machine  100 each  11  . lisinopril-hydrochlorothiazide (PRINZIDE,ZESTORETIC) 20-25 MG per tablet TAKE 1 TABLET BY MOUTH ONCE DAILY  90 tablet  0  . metFORMIN (GLUCOPHAGE-XR) 750 MG 24 hr tablet TAKE 2 TABLETS BY MOUTH DAILY WITH BREAKFAST  180 tablet  0  . Multiple Vitamin (MULTIVITAMIN) capsule Take 1 capsule by mouth.      . [DISCONTINUED] IRON PO Take 1 tablet by mouth 3 (three) times daily after meals.        . [DISCONTINUED] Norethin-Eth Estrad-Fe Biphas (LO LOESTRIN FE PO) Take 1 tablet by mouth 2 (two) times daily.        . [DISCONTINUED] pioglitazone (ACTOS)  30 MG tablet Take 30 mg by mouth daily.         No current facility-administered medications for this visit.    LABS/IMAGING: No results found for this or any previous visit (from the past 48 hour(s)). No results found.  VITALS: BP 138/90  Pulse 69  Ht 5\' 2"  (1.575 m)  Wt 153 lb 9.6 oz (69.673 kg)  BMI 28.09 kg/m2  EXAM: General appearance: alert and no distress Neck: no carotid bruit and no JVD Lungs: clear to auscultation bilaterally Heart: regular rate and rhythm, S1, S2 normal, no murmur, click, rub or gallop Abdomen: soft, non-tender; bowel sounds normal; no masses,  no organomegaly Extremities: extremities normal, atraumatic, no cyanosis or edema Pulses: 2+ and symmetric Skin: Skin color, texture, turgor normal. No rashes or lesions Neurologic:  Grossly normal Psych: Anxious  EKG: Normal sinus rhythm at 69  ASSESSMENT: 1. Atypical chest/neck pain 2. Diabetes type 2 3. Hypertension 4. Family history coronary disease  PLAN: 1.   [name] is describing what sounds like atypical chest pain however she said she had complete relief and finally her chest discomfort but her upper back and neck pain as well as leg pain after taking 4 Chinese heart pills which may contain nitroglycerin and/or [place] herbs. It is difficult to say whether this is truly nitrate responsive chest pain. Not clear what her symptoms are and as well as what is the cause of her fatigue and episodes of diarrhea. I wonder if those may be due to metformin. I would recommend an exercise stress test to further evaluate for coronary disease however I think it is low probability. Plan to see her back to discuss results of the stress test when they're available.  Thanks for the referral.  Chrystie Nose, MD, [facility] Attending Cardiologist [facility]  Koy Lamp C 07/18/2014, 9:50 AM

## 2014-07-23 ENCOUNTER — Other Ambulatory Visit: Payer: Self-pay | Admitting: Internal Medicine

## 2014-07-26 ENCOUNTER — Telehealth (HOSPITAL_COMMUNITY): Payer: Self-pay | Admitting: *Deleted

## 2014-08-05 ENCOUNTER — Telehealth (HOSPITAL_COMMUNITY): Payer: Self-pay | Admitting: *Deleted

## 2014-08-12 ENCOUNTER — Telehealth (HOSPITAL_COMMUNITY): Payer: Self-pay | Admitting: *Deleted

## 2014-09-16 ENCOUNTER — Other Ambulatory Visit: Payer: Self-pay | Admitting: Internal Medicine

## 2014-09-17 NOTE — Telephone Encounter (Signed)
Sent to the pharmacy by e-scribe. 

## 2014-10-04 ENCOUNTER — Other Ambulatory Visit: Payer: Self-pay | Admitting: Family Medicine

## 2014-10-21 ENCOUNTER — Encounter: Payer: Self-pay | Admitting: Internal Medicine

## 2015-01-28 ENCOUNTER — Other Ambulatory Visit: Payer: Self-pay | Admitting: Internal Medicine

## 2015-01-28 ENCOUNTER — Other Ambulatory Visit: Payer: Self-pay | Admitting: Family Medicine

## 2015-01-28 ENCOUNTER — Telehealth: Payer: Self-pay | Admitting: Family Medicine

## 2015-01-28 DIAGNOSIS — I10 Essential (primary) hypertension: Secondary | ICD-10-CM

## 2015-01-28 DIAGNOSIS — E119 Type 2 diabetes mellitus without complications: Secondary | ICD-10-CM

## 2015-01-28 NOTE — Telephone Encounter (Signed)
Next visit was to be determined by labs.  Please advise.  Thanks!

## 2015-01-28 NOTE — Telephone Encounter (Signed)
lmom for pt to cb

## 2015-01-28 NOTE — Telephone Encounter (Signed)
This patient is past due for her diabetes check.  Please make a lab and follow up appt with WP.  Thanks! I have placed the orders for lab work.

## 2015-01-28 NOTE — Telephone Encounter (Signed)
Per WP, okay to send in 30 day supply.  Message sent to scheduling for lab work and follow up appt.

## 2015-01-28 NOTE — Telephone Encounter (Signed)
Over due for diabetes check   Make appt and lab  And OV  Is  She seeing other provider for her diabetes?  Needs bmp hga1c    And OV     can refill med up until appt  After scheduled  No more than 3-4 weeks

## 2015-01-30 NOTE — Telephone Encounter (Signed)
Pt has been sch

## 2015-02-14 ENCOUNTER — Other Ambulatory Visit (INDEPENDENT_AMBULATORY_CARE_PROVIDER_SITE_OTHER): Payer: BC Managed Care – PPO

## 2015-02-14 DIAGNOSIS — I10 Essential (primary) hypertension: Secondary | ICD-10-CM

## 2015-02-14 DIAGNOSIS — E119 Type 2 diabetes mellitus without complications: Secondary | ICD-10-CM

## 2015-02-14 DIAGNOSIS — E785 Hyperlipidemia, unspecified: Secondary | ICD-10-CM

## 2015-02-14 LAB — BASIC METABOLIC PANEL
BUN: 10 mg/dL (ref 6–23)
CO2: 32 mEq/L (ref 19–32)
Calcium: 9.2 mg/dL (ref 8.4–10.5)
Chloride: 99 mEq/L (ref 96–112)
Creatinine, Ser: 0.69 mg/dL (ref 0.40–1.20)
GFR: 96.1 mL/min (ref >60.00)
Glucose, Bld: 147 mg/dL — ABNORMAL HIGH (ref 70–99)
Potassium: 3.6 mEq/L (ref 3.5–5.1)
SODIUM: 138 meq/L (ref 135–145)

## 2015-02-14 LAB — LDL CHOLESTEROL, DIRECT: Direct LDL: 122 mg/dL

## 2015-02-14 LAB — LIPID PANEL
CHOL/HDL RATIO: 4
Cholesterol: 212 mg/dL — ABNORMAL HIGH (ref 0–200)
HDL: 49.7 mg/dL (ref >39.00)
NonHDL: 162.3
TRIGLYCERIDES: 247 mg/dL — AB (ref 0.0–149.0)
VLDL: 49.4 mg/dL — ABNORMAL HIGH (ref 0.0–40.0)

## 2015-02-14 LAB — HEMOGLOBIN A1C: HEMOGLOBIN A1C: 6.8 % — AB (ref 4.6–6.5)

## 2015-02-21 ENCOUNTER — Encounter: Payer: Self-pay | Admitting: Internal Medicine

## 2015-02-21 ENCOUNTER — Ambulatory Visit (INDEPENDENT_AMBULATORY_CARE_PROVIDER_SITE_OTHER): Payer: BC Managed Care – PPO | Admitting: Internal Medicine

## 2015-02-21 VITALS — BP 104/62 | Temp 98.4°F | Ht 62.0 in | Wt 151.0 lb

## 2015-02-21 DIAGNOSIS — E785 Hyperlipidemia, unspecified: Secondary | ICD-10-CM

## 2015-02-21 DIAGNOSIS — R197 Diarrhea, unspecified: Secondary | ICD-10-CM

## 2015-02-21 DIAGNOSIS — E119 Type 2 diabetes mellitus without complications: Secondary | ICD-10-CM

## 2015-02-21 DIAGNOSIS — R7989 Other specified abnormal findings of blood chemistry: Secondary | ICD-10-CM

## 2015-02-21 DIAGNOSIS — R945 Abnormal results of liver function studies: Secondary | ICD-10-CM

## 2015-02-21 DIAGNOSIS — K760 Fatty (change of) liver, not elsewhere classified: Secondary | ICD-10-CM

## 2015-02-21 DIAGNOSIS — I10 Essential (primary) hypertension: Secondary | ICD-10-CM

## 2015-02-21 MED ORDER — SITAGLIP PHOS-METFORMIN HCL ER 50-1000 MG PO TB24: 2.0000 | ORAL_TABLET | Freq: Every day | ORAL | Status: DC

## 2015-02-21 MED ORDER — SITAGLIPTIN PHOS-METFORMIN HCL 50-1000 MG PO TABS: 1.0000 | ORAL_TABLET | Freq: Two times a day (BID) | ORAL | Status: DC

## 2015-02-21 NOTE — Patient Instructions (Signed)
BP   Is good today . We can try  The combination janumet  . If too much diarrhea we can try another route. Victoza is a good med   Other injectables seems to have a good track record. Trulicity is once a week.  After starting   Medication for a few weeks. Begin the lipid medicine atorvastatin  3 months labs and ROV  Diabetes and Standards of Medical Care Diabetes is complicated. You may find that your diabetes team includes a dietitian, nurse, diabetes educator, eye doctor, and more. To help everyone know what is going on and to help you get the care you deserve, the following schedule of care was developed to help keep you on track. Below are the tests, exams, vaccines, medicines, education, and plans you will need. HbA1c test This test shows how well you have controlled your glucose over the past 2-3 months. It is used to see if your diabetes management plan needs to be adjusted.   It is performed at least 2 times a year if you are meeting treatment goals.  It is performed 4 times a year if therapy has changed or if you are not meeting treatment goals. Blood pressure test  This test is performed at every routine medical visit. The goal is less than 140/90 mm Hg for most people, but 130/80 mm Hg in some cases. Ask your health care provider about your goal. Dental exam  Follow up with the dentist regularly. Eye exam  If you are diagnosed with type 1 diabetes as a child, get an exam upon reaching the age of 67 years or older and have had diabetes for 3-5 years. Yearly eye exams are recommended after that initial eye exam.  If you are diagnosed with type 1 diabetes as an adult, get an exam within 5 years of diagnosis and then yearly.  If you are diagnosed with type 2 diabetes, get an exam as soon as possible after the diagnosis and then yearly. Foot care exam  Visual foot exams are performed at every routine medical visit. The exams check for cuts, injuries, or other problems with the  feet.  A comprehensive foot exam should be done yearly. This includes visual inspection as well as assessing foot pulses and testing for loss of sensation.  Check your feet nightly for cuts, injuries, or other problems with your feet. Tell your health care provider if anything is not healing. Kidney function test (urine microalbumin)  This test is performed once a year.  Type 1 diabetes: The first test is performed 5 years after diagnosis.  Type 2 diabetes: The first test is performed at the time of diagnosis.  A serum creatinine and estimated glomerular filtration rate (eGFR) test is done once a year to assess the level of chronic kidney disease (CKD), if present. Lipid profile (cholesterol, HDL, LDL, triglycerides)  Performed every 5 years for most people.  The goal for LDL is less than 100 mg/dL. If you are at high risk, the goal is less than 70 mg/dL.  The goal for HDL is 40 mg/dL-50 mg/dL for men and 50 mg/dL-60 mg/dL for women. An HDL cholesterol of 60 mg/dL or higher gives some protection against heart disease.  The goal for triglycerides is less than 150 mg/dL. Influenza vaccine, pneumococcal vaccine, and hepatitis B vaccine  The influenza vaccine is recommended yearly.  It is recommended that people with diabetes who are over 71 years old get the pneumonia vaccine. In some cases, two separate  shots may be given. Ask your health care provider if your pneumonia vaccination is up to date.  The hepatitis B vaccine is also recommended for adults with diabetes. Diabetes self-management education  Education is recommended at diagnosis and ongoing as needed. Treatment plan  Your treatment plan is reviewed at every medical visit. Document Released: 10/03/2009 Document Revised: 04/22/2014 Document Reviewed: 05/08/2013 [facility] Patient Information 2015 [place], [place]. This information is not intended to replace advice given to you by your health care provider. Make sure you  discuss any questions you have with your health care provider.

## 2015-02-21 NOTE — Progress Notes (Signed)
Pre visit review using our clinic review tool, if applicable. No additional management support is needed unless otherwise documented below in the visit note.  Chief Complaint  Patient presents with  . Follow-up    HPI: Amber [name]  49 y.o.  comesin for fu of multiple med problems Has seen endo dr Buddy Duty who advised adding victoza inj  To avoid diarrhea from the metformin . She didn't think she wanted to do that and says the copay is  Much higher than ours PCP.   DM no low  Fasting 130s pp 180 or such  Has tom loose stool she is attributing to dm and not the meds ?  CP atypiocal saw cards  Should be on llipid medication  Didn't take it  BP up and down  Taking med  No se reported   ROS: See pertinent positives and negatives per HPI. No cp sob neuropathy eye sx   Past Medical History  Diagnosis Date  . Diabetes mellitus   . Hyperlipidemia   . Hypertension   . Anemia     Family History  Problem Relation Age of Onset  . Hypertension Mother   . Diabetes Father   . Diabetes Other     Grandparent  . Stroke Other     Grandparent  . Hyperlipidemia      parents  . Developmental delay Daughter     History   Social History  . Marital Status: Married    Spouse Name: N/A  . Number of Children: 1  . Years of Education: BS   Occupational History  . Acupuncturist    Social History Main Topics  . Smoking status: Never Smoker   . Smokeless tobacco: Not on file  . Alcohol Use: No  . Drug Use: No  . Sexual Activity: Yes    Birth Control/ Protection: Pill   Other Topics Concern  . 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    G1P1 child has developmental disability  Poss genetically based eval at [facility]   hh of 3  34 hours per week    Neg etsFA   [place] last eye exam .     Outpatient Encounter Prescriptions as of 02/21/2015  Medication Sig  . Coenzyme Q10 (CO Q 10 PO) Take by mouth. Occasionally  . glucose blood test strip  Use bid  . Lancets Thin MISC Use bid- pt needs lancets that goes with the contour machine  . lisinopril-hydrochlorothiazide (PRINZIDE,ZESTORETIC) 20-25 MG per tablet TAKE 1 TABLET BY MOUTH ONCE A DAY  . metFORMIN (GLUCOPHAGE-XR) 750 MG 24 hr tablet TAKE 2 TABLETS BY MOUTH DAILY WITH BREAKFAST  . Multiple Vitamin (MULTIVITAMIN) capsule Take 1 capsule by mouth.  Marland Kitchen atorvastatin (LIPITOR) 10 MG tablet Take 10 mg by mouth at bedtime.  . Liraglutide (VICTOZA) 18 MG/3ML SOPN Inject into the skin. 0.6 MG ONCE A DAY FOR THE FIRST TWO WEEKS, THEN 1.2 MG ONCE A DAY FOR THE NEXT TWO WEEKS, THEN 1.8 MG ONCE A DAY THEREAFTER.  . SitaGLIPtin-MetFORMIN HCl 50-1000 MG TB24 Take 2 tablets by mouth daily.  . [DISCONTINUED] Cyanocobalamin (VITAMIN B 12 PO) Take by mouth daily.  . [DISCONTINUED] sitaGLIPtin-metformin (JANUMET) 50-1000 MG per tablet Take 1 tablet by mouth 2 (two) times daily with a meal.    EXAM:  BP 104/62 mmHg  Temp(Src) 98.4 F (36.9 C) (Oral)  Ht '5\' 2"'  (1.575 m)  Wt  151 lb (68.493 kg)  BMI 27.61 kg/m2  Body mass index is 27.61 kg/(m^2).  GENERAL: vitals reviewed and listed above, alert, oriented, appears well hydrated and in no acute distress MS: moves all extremities without noticeable focal  abnormality PSYCH: pleasant and cooperative, no obvious depression or anxiety Lab Results  Component Value Date   WBC 6.9 06/19/2014   HGB 13.0 06/19/2014   HCT 38.3 06/19/2014   PLT 226.0 06/19/2014   GLUCOSE 147* 02/14/2015   CHOL 212* 02/14/2015   TRIG 247.0* 02/14/2015   HDL 49.70 02/14/2015   LDLDIRECT 122.0 02/14/2015   LDLCALC 102* 06/19/2014   ALT 55* 06/19/2014   AST 42* 06/19/2014   NA 138 02/14/2015   K 3.6 02/14/2015   CL 99 02/14/2015   CREATININE 0.69 02/14/2015   BUN 10 02/14/2015   CO2 32 02/14/2015   TSH 2.50 06/19/2014   INR 1.03 09/12/2011   HGBA1C 6.8* 02/14/2015   MICROALBUR 181.0 Repeated and verified X2.* 06/19/2014    ASSESSMENT AND PLAN:  Discussed  the following assessment and plan:  Diabetes mellitus without complication - uncertain if proteinuria  confrimed  plan check at next visit   Essential hypertension  Abnormal LFTs  Diarrhea - presumed from med pt thinks not  Hepatic steatosis  Hyperlipidemia - hasnt taken med yet  Need liver fu  reviewed labs and management plan . Encouraged glp inhibotr trial but asks about [place] .  At thsi time she weill intensify lsi and will change to janumet enc met to 2000 sot  monitor for se .    Expectant management. Hold the plain metformin coupons given . After on new med  Add atorva     Plan 3 months fu with labs  Total visit 54mns > 50% spent counseling and coordinating care   -Patient advised to return or notify health care team  if symptoms worsen ,persist or new concerns arise.  Patient Instructions  BP   Is good today . We can try  The combination janumet  . If too much diarrhea we can try another route. Victoza is a good med   Other injectables seems to have a good track record. Trulicity is once a week.  After starting   Medication for a few weeks. Begin the lipid medicine atorvastatin  3 months labs and ROV  Diabetes and Standards of Medical Care Diabetes is complicated. You may find that your diabetes team includes a dietitian, nurse, diabetes educator, eye doctor, and more. To help everyone know what is going on and to help you get the care you deserve, the following schedule of care was developed to help keep you on track. Below are the tests, exams, vaccines, medicines, education, and plans you will need. HbA1c test This test shows how well you have controlled your glucose over the past 2-3 months. It is used to see if your diabetes management plan needs to be adjusted.   It is performed at least 2 times a year if you are meeting treatment goals.  It is performed 4 times a year if therapy has changed or if you are not meeting treatment goals. Blood pressure test  This  test is performed at every routine medical visit. The goal is less than 140/90 mm Hg for most people, but 130/80 mm Hg in some cases. Ask your health care provider about your goal. Dental exam  Follow up with the dentist regularly. Eye exam  If you are diagnosed with type 1 diabetes as  a child, get an exam upon reaching the age of 96 years or older and have had diabetes for 3-5 years. Yearly eye exams are recommended after that initial eye exam.  If you are diagnosed with type 1 diabetes as an adult, get an exam within 5 years of diagnosis and then yearly.  If you are diagnosed with type 2 diabetes, get an exam as soon as possible after the diagnosis and then yearly. Foot care exam  Visual foot exams are performed at every routine medical visit. The exams check for cuts, injuries, or other problems with the feet.  A comprehensive foot exam should be done yearly. This includes visual inspection as well as assessing foot pulses and testing for loss of sensation.  Check your feet nightly for cuts, injuries, or other problems with your feet. Tell your health care provider if anything is not healing. Kidney function test (urine microalbumin)  This test is performed once a year.  Type 1 diabetes: The first test is performed 5 years after diagnosis.  Type 2 diabetes: The first test is performed at the time of diagnosis.  A serum creatinine and estimated glomerular filtration rate (eGFR) test is done once a year to assess the level of chronic kidney disease (CKD), if present. Lipid profile (cholesterol, HDL, LDL, triglycerides)  Performed every 5 years for most people.  The goal for LDL is less than 100 mg/dL. If you are at high risk, the goal is less than 70 mg/dL.  The goal for HDL is 40 mg/dL-50 mg/dL for men and 50 mg/dL-60 mg/dL for women. An HDL cholesterol of 60 mg/dL or higher gives some protection against heart disease.  The goal for triglycerides is less than 150  mg/dL. Influenza vaccine, pneumococcal vaccine, and hepatitis B vaccine  The influenza vaccine is recommended yearly.  It is recommended that people with diabetes who are over 43 years old get the pneumonia vaccine. In some cases, two separate shots may be given. Ask your health care provider if your pneumonia vaccination is up to date.  The hepatitis B vaccine is also recommended for adults with diabetes. Diabetes self-management education  Education is recommended at diagnosis and ongoing as needed. Treatment plan  Your treatment plan is reviewed at every medical visit. Document Released: 10/03/2009 Document Revised: 04/22/2014 Document Reviewed: 05/08/2013 [facility] Patient Information 2015 [place], [place]. This information is not intended to replace advice given to you by your health care provider. Make sure you discuss any questions you have with your health care provider.       Standley Brooking. Rylen Hou M.D.  Diabetes

## 2015-02-23 DIAGNOSIS — E119 Type 2 diabetes mellitus without complications: Secondary | ICD-10-CM | POA: Insufficient documentation

## 2015-02-23 NOTE — Assessment & Plan Note (Signed)
reviewed labs and management plan . Encouraged glp inhibotr trial but asks about [place] .  At thsi time she weill intensify lsi and will change to janumet enc met to 2000 sot  monitor for se .    Expectant management. Hold the plain metformin coupons given . After on new med  Add atorva     Plan 3 months fu with labs

## 2015-05-23 ENCOUNTER — Other Ambulatory Visit (INDEPENDENT_AMBULATORY_CARE_PROVIDER_SITE_OTHER): Payer: BC Managed Care – PPO

## 2015-05-23 DIAGNOSIS — I10 Essential (primary) hypertension: Secondary | ICD-10-CM | POA: Diagnosis not present

## 2015-05-23 DIAGNOSIS — E1310 Other specified diabetes mellitus with ketoacidosis without coma: Secondary | ICD-10-CM

## 2015-05-23 DIAGNOSIS — E785 Hyperlipidemia, unspecified: Secondary | ICD-10-CM | POA: Diagnosis not present

## 2015-05-23 DIAGNOSIS — E1110 Type 2 diabetes mellitus with ketoacidosis without coma: Secondary | ICD-10-CM

## 2015-05-23 LAB — HEPATIC FUNCTION PANEL
ALT: 28 U/L (ref 0–35)
AST: 19 U/L (ref 0–37)
Albumin: 4.2 g/dL (ref 3.5–5.2)
Alkaline Phosphatase: 54 U/L (ref 39–117)
Bilirubin, Direct: 0 mg/dL (ref 0.0–0.3)
TOTAL PROTEIN: 7.3 g/dL (ref 6.0–8.3)
Total Bilirubin: 0.3 mg/dL (ref 0.2–1.2)

## 2015-05-23 LAB — HEMOGLOBIN A1C: HEMOGLOBIN A1C: 6.1 % (ref 4.6–6.5)

## 2015-05-23 LAB — LIPID PANEL
CHOLESTEROL: 199 mg/dL (ref 0–200)
HDL: 41.4 mg/dL (ref >39.00)
Total CHOL/HDL Ratio: 5
Triglycerides: 567 mg/dL — ABNORMAL HIGH (ref 0.0–149.0)

## 2015-05-23 LAB — BASIC METABOLIC PANEL
BUN: 12 mg/dL (ref 6–23)
CALCIUM: 9.2 mg/dL (ref 8.4–10.5)
CO2: 29 meq/L (ref 19–32)
Chloride: 99 mEq/L (ref 96–112)
Creatinine, Ser: 0.72 mg/dL (ref 0.40–1.20)
GFR: 91.4 mL/min (ref >60.00)
GLUCOSE: 130 mg/dL — AB (ref 70–99)
Potassium: 3.9 mEq/L (ref 3.5–5.1)
Sodium: 134 mEq/L — ABNORMAL LOW (ref 135–145)

## 2015-05-23 LAB — LDL CHOLESTEROL, DIRECT: LDL DIRECT: 83 mg/dL

## 2015-05-30 ENCOUNTER — Encounter: Payer: Self-pay | Admitting: Internal Medicine

## 2015-05-30 ENCOUNTER — Ambulatory Visit (INDEPENDENT_AMBULATORY_CARE_PROVIDER_SITE_OTHER): Payer: BC Managed Care – PPO | Admitting: Internal Medicine

## 2015-05-30 VITALS — BP 128/86 | Temp 97.7°F | Ht 62.0 in | Wt 152.6 lb

## 2015-05-30 DIAGNOSIS — E782 Mixed hyperlipidemia: Secondary | ICD-10-CM | POA: Diagnosis not present

## 2015-05-30 DIAGNOSIS — I10 Essential (primary) hypertension: Secondary | ICD-10-CM | POA: Diagnosis not present

## 2015-05-30 DIAGNOSIS — E119 Type 2 diabetes mellitus without complications: Secondary | ICD-10-CM | POA: Diagnosis not present

## 2015-05-30 DIAGNOSIS — E785 Hyperlipidemia, unspecified: Secondary | ICD-10-CM

## 2015-05-30 NOTE — Patient Instructions (Signed)
Continue lifestyle intervention healthy eating and exercise .  Stay on same medication .  We need to get thyroid and repeat lipid panel to reassess next step.   Can add fish oil to help  otc or rx but can wait on this and then plan recheck in  2-3 months   Labs in 2 months and then OV        Food Choices to Lower Your Triglycerides  Triglycerides are a type of fat in your blood. High levels of triglycerides can increase the risk of heart disease and stroke. If your triglyceride levels are high, the foods you eat and your eating habits are very important. Choosing the right foods can help lower your triglycerides.  WHAT GENERAL GUIDELINES DO I NEED TO FOLLOW?  Lose weight if you are overweight.   Limit or avoid alcohol.   Fill one half of your plate with vegetables and green salads.   Limit fruit to two servings a day. Choose fruit instead of juice.   Make one fourth of your plate whole grains. Look for the word "whole" as the first word in the ingredient list.  Fill one fourth of your plate with lean protein foods.  Enjoy fatty fish (such as salmon, mackerel, sardines, and tuna) three times a week.   Choose healthy fats.   Limit foods high in starch and sugar.  Eat more home-cooked food and less restaurant, buffet, and fast food.  Limit fried foods.  Cook foods using methods other than frying.  Limit saturated fats.  Check ingredient lists to avoid foods with partially hydrogenated oils (trans fats) in them. WHAT FOODS CAN I EAT?  Grains Whole grains, such as whole wheat or whole grain breads, crackers, cereals, and pasta. Unsweetened oatmeal, bulgur, barley, quinoa, or brown rice. Corn or whole wheat flour tortillas.  Vegetables Fresh or frozen vegetables (raw, steamed, roasted, or grilled). Green salads. Fruits All fresh, canned (in natural juice), or frozen fruits. Meat and Other Protein Products Ground beef (85% or leaner), grass-fed beef, or beef  trimmed of fat. Skinless chicken or [place]. Ground chicken or [place]. Pork trimmed of fat. All fish and seafood. Eggs. Dried beans, peas, or lentils. Unsalted nuts or seeds. Unsalted canned or dry beans. Dairy Low-fat dairy products, such as skim or 1% milk, 2% or reduced-fat cheeses, low-fat ricotta or cottage cheese, or plain low-fat yogurt. Fats and Oils Tub margarines without trans fats. Light or reduced-fat mayonnaise and salad dressings. Avocado. Safflower, olive, or canola oils. Natural peanut or almond butter. The items listed above may not be a complete list of recommended foods or beverages. Contact your dietitian for more options. WHAT FOODS ARE NOT RECOMMENDED?  Grains White bread. White pasta. White rice. Cornbread. Bagels, pastries, and croissants. Crackers that contain trans fat. Vegetables White potatoes. Corn. Creamed or fried vegetables. Vegetables in a cheese sauce. Fruits Dried fruits. Canned fruit in light or heavy syrup. Fruit juice. Meat and Other Protein Products Fatty cuts of meat. Ribs, chicken wings, bacon, sausage, bologna, salami, chitterlings, fatback, hot dogs, bratwurst, and packaged luncheon meats. Dairy Whole or 2% milk, cream, half-and-half, and cream cheese. Whole-fat or sweetened yogurt. Full-fat cheeses. Nondairy creamers and whipped toppings. Processed cheese, cheese spreads, or cheese curds. Sweets and Desserts Corn syrup, sugars, honey, and molasses. Candy. Jam and jelly. Syrup. Sweetened cereals. Cookies, pies, cakes, donuts, muffins, and ice cream. Fats and Oils Butter, stick margarine, lard, shortening, ghee, or bacon fat. Coconut, palm kernel, or palm oils. Beverages  Alcohol. Sweetened drinks (such as sodas, lemonade, and fruit drinks or punches). The items listed above may not be a complete list of foods and beverages to avoid. Contact your dietitian for more information. Document Released: 09/23/2004 Document Revised: 12/11/2013 Document  Reviewed: 10/10/2013 [facility] Patient Information 2015 [place], [place]. This information is not intended to replace advice given to you by your health care provider. Make sure you discuss any questions you have with your health care provider.

## 2015-05-30 NOTE — Progress Notes (Signed)
Pre visit review using our clinic review tool, if applicable. No additional management support is needed unless otherwise documented below in the visit note.  Chief Complaint  Patient presents with  . Follow-up  . Diabetes    HPI: Amber [name] 49 y.o.  comes in for chronic disease/ medication management    DM: since last time changed to janumet   Feels more energized and thinks the janumet doing a good job  fbs lower in 1210 range .  exrciseing swimming an walking  No major change in diet brwon rice and cards  Doesn't eat out much .   ? Cases nose congestion but doing ok .  Had episode of med chest thorax pain better after chiro massage  No ass sx .  ROS: See pertinent positives and negatives per HPI.no numbness vision change not taking victoza   Past Medical History  Diagnosis Date  . Diabetes mellitus   . Hyperlipidemia   . Hypertension   . Anemia     Family History  Problem Relation Age of Onset  . Hypertension Mother   . Diabetes Father   . Diabetes Other     Grandparent  . Stroke Other     Grandparent  . Hyperlipidemia      parents  . Developmental delay Daughter     History   Social History  . Marital Status: Married    Spouse Name: N/A  . Number of Children: 1  . Years of Education: BS   Occupational History  . Acupuncturist    Social History Main Topics  . Smoking status: Never Smoker   . Smokeless tobacco: Not on file  . Alcohol Use: No  . Drug Use: No  . Sexual Activity: Yes    Birth Control/ Protection: Pill   Other Topics Concern  . 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     Outpatient Prescriptions Prior to Visit  Medication Sig Dispense Refill  . Coenzyme Q10 (CO Q 10 PO) Take by mouth. Occasionally    . glucose blood test strip  Use bid 100 each 12  . Lancets Thin MISC Use bid- pt needs lancets that goes with the contour machine 100 each 11  . lisinopril-hydrochlorothiazide (PRINZIDE,ZESTORETIC) 20-25 MG per tablet TAKE 1 TABLET BY MOUTH ONCE A DAY 90 tablet 2  . Multiple Vitamin (MULTIVITAMIN) capsule Take 1 capsule by mouth.    . SitaGLIPtin-MetFORMIN HCl 50-1000 MG TB24 Take 2 tablets by mouth daily. 60 tablet 5  . atorvastatin (LIPITOR) 10 MG tablet Take 10 mg by mouth at bedtime.    . Liraglutide (VICTOZA) 18 MG/3ML SOPN Inject into the skin. 0.6 MG ONCE A DAY FOR THE FIRST TWO WEEKS, THEN 1.2 MG ONCE A DAY FOR THE NEXT TWO WEEKS, THEN 1.8 MG ONCE A DAY THEREAFTER.    . metFORMIN (GLUCOPHAGE-XR) 750 MG 24 hr tablet TAKE 2 TABLETS BY MOUTH DAILY WITH BREAKFAST 60 tablet 0   No facility-administered medications prior to visit.     EXAM:  BP 128/86 mmHg  Temp(Src) 97.7 F (36.5 C) (Oral)  Ht  (1.575 m)  Wt 152 lb 9.6 oz (69.219 kg)  BMI 27.90 kg/m2  Body  mass index is 27.9 kg/(m^2).  GENERAL: vitals reviewed and listed above, alert, oriented, appears well hydrated and in no acute distress HEENT: atraumatic, conjunctiva  clear, no obvious abnormalities on inspection of external nose and ears NECK: no obvious masses on inspection palpation  LUNGS: clear to auscultation bilaterally, no wheezes, rales or rhonchi, good air movement  CV: HRRR, no clubbing cyanosis or  peripheral edema nl cap refill  MS: moves all extremities without noticeable focal  abnormality PSYCH: pleasant and cooperative, no obvious depression or anxiety Lab Results  Component Value Date   WBC 6.9 06/19/2014   HGB 13.0 06/19/2014   HCT 38.3 06/19/2014   PLT 226.0 06/19/2014   GLUCOSE 130* 05/23/2015   CHOL 199 05/23/2015   TRIG * 05/23/2015    567.0 Triglyceride is over 400; calculations on Lipids are invalid.   HDL 41.40 05/23/2015   LDLDIRECT 83.0 05/23/2015   LDLCALC 102* 06/19/2014   ALT 28 05/23/2015   AST 19  05/23/2015   NA 134* 05/23/2015   K 3.9 05/23/2015   CL 99 05/23/2015   CREATININE 0.72 05/23/2015   BUN 12 05/23/2015   CO2 29 05/23/2015   TSH 2.50 06/19/2014   INR 1.03 09/12/2011   HGBA1C 6.1 05/23/2015   MICROALBUR 181.0 Repeated and verified X2.* 06/19/2014    ASSESSMENT AND PLAN:  Discussed the following assessment and plan:  Diabetes mellitus without complication - ggod readings   Elevated cholesterol with high triglycerides - uncetain why tg went up with better control disc diet cont exrecise can add otc fo if wants and recheck in 2-3 months etc.  Essential hypertension  Hyperlipidemia - ldl at goal  Check tsh and lipids in a few months  options to go to lipid clinic but  Attend to more scrutiny lsi for now and may try otc FO -Patient advised to return or notify health care team  if symptoms worsen ,persist or new concerns arise. Total visit > 50% spent counseling and coordinating care as indicated in above note and in instructions to patient .     Patient Instructions  Continue lifestyle intervention healthy eating and exercise .  Stay on same medication .  We need to get thyroid and repeat lipid panel to reassess next step.   Can add fish oil to help  otc or rx but can wait on this and then plan recheck in  2-3 months   Labs in 2 months and then OV        Food Choices to Lower Your Triglycerides  Triglycerides are a type of fat in your blood. High levels of triglycerides can increase the risk of heart disease and stroke. If your triglyceride levels are high, the foods you eat and your eating habits are very important. Choosing the right foods can help lower your triglycerides.  WHAT GENERAL GUIDELINES DO I NEED TO FOLLOW?  Lose weight if you are overweight.   Limit or avoid alcohol.   Fill one half of your plate with vegetables and green salads.   Limit fruit to two servings a day. Choose fruit instead of juice.   Make one fourth of your  plate whole grains. Look for the word "whole" as the first word in the ingredient list.  Fill one fourth of your plate with lean protein foods.  Enjoy fatty fish (such as salmon, mackerel, sardines, and tuna) three times a week.   Choose healthy fats.   Limit foods high in starch and sugar.  Eat more home-cooked food and less restaurant, buffet, and fast food.  Limit fried foods.  Cook foods using methods other than frying.  Limit saturated fats.  Check ingredient lists to avoid foods with partially hydrogenated oils (trans fats) in them. WHAT FOODS CAN I EAT?  Grains Whole grains, such as whole wheat or whole grain breads, crackers, cereals, and pasta. Unsweetened oatmeal, bulgur, barley, quinoa, or brown rice. Corn or whole wheat flour tortillas.  Vegetables Fresh or frozen vegetables (raw, steamed, roasted, or grilled). Green salads. Fruits All fresh, canned (in natural juice), or frozen fruits. Meat and Other Protein Products Ground beef (85% or leaner), grass-fed beef, or beef trimmed of fat. Skinless chicken or [place]. Ground chicken or [place]. Pork trimmed of fat. All fish and seafood. Eggs. Dried beans, peas, or lentils. Unsalted nuts or seeds. Unsalted canned or dry beans. Dairy Low-fat dairy products, such as skim or 1% milk, 2% or reduced-fat cheeses, low-fat ricotta or cottage cheese, or plain low-fat yogurt. Fats and Oils Tub margarines without trans fats. Light or reduced-fat mayonnaise and salad dressings. Avocado. Safflower, olive, or canola oils. Natural peanut or almond butter. The items listed above may not be a complete list of recommended foods or beverages. Contact your dietitian for more options. WHAT FOODS ARE NOT RECOMMENDED?  Grains White bread. White pasta. White rice. Cornbread. Bagels, pastries, and croissants. Crackers that contain trans fat. Vegetables White potatoes. Corn. Creamed or fried vegetables. Vegetables in a cheese sauce. Fruits Dried  fruits. Canned fruit in light or heavy syrup. Fruit juice. Meat and Other Protein Products Fatty cuts of meat. Ribs, chicken wings, bacon, sausage, bologna, salami, chitterlings, fatback, hot dogs, bratwurst, and packaged luncheon meats. Dairy Whole or 2% milk, cream, half-and-half, and cream cheese. Whole-fat or sweetened yogurt. Full-fat cheeses. Nondairy creamers and whipped toppings. Processed cheese, cheese spreads, or cheese curds. Sweets and Desserts Corn syrup, sugars, honey, and molasses. Candy. Jam and jelly. Syrup. Sweetened cereals. Cookies, pies, cakes, donuts, muffins, and ice cream. Fats and Oils Butter, stick margarine, lard, shortening, ghee, or bacon fat. Coconut, palm kernel, or palm oils. Beverages Alcohol. Sweetened drinks (such as sodas, lemonade, and fruit drinks or punches). The items listed above may not be a complete list of foods and beverages to avoid. Contact your dietitian for more information. Document Released: 09/23/2004 Document Revised: 12/11/2013 Document Reviewed: 10/10/2013 [facility] Patient Information 2015 [place], [place]. This information is not intended to replace advice given to you by your health care provider. Make sure you discuss any questions you have with your health care provider.      Neta Mends. Shermeka Rutt M.D.

## 2015-06-03 ENCOUNTER — Other Ambulatory Visit: Payer: Self-pay | Admitting: Internal Medicine

## 2015-06-03 NOTE — Telephone Encounter (Signed)
Sent to the pharmacy by e-scribe. 

## 2015-09-10 ENCOUNTER — Other Ambulatory Visit (INDEPENDENT_AMBULATORY_CARE_PROVIDER_SITE_OTHER): Payer: BC Managed Care – PPO

## 2015-09-10 DIAGNOSIS — E039 Hypothyroidism, unspecified: Secondary | ICD-10-CM | POA: Diagnosis not present

## 2015-09-10 DIAGNOSIS — E785 Hyperlipidemia, unspecified: Secondary | ICD-10-CM | POA: Diagnosis not present

## 2015-09-10 LAB — LIPID PANEL
CHOLESTEROL: 203 mg/dL — AB (ref 0–200)
HDL: 51.6 mg/dL (ref >39.00)
NonHDL: 151.37
TRIGLYCERIDES: 263 mg/dL — AB (ref 0.0–149.0)
Total CHOL/HDL Ratio: 4
VLDL: 52.6 mg/dL — AB (ref 0.0–40.0)

## 2015-09-10 LAB — LDL CHOLESTEROL, DIRECT: Direct LDL: 128 mg/dL

## 2015-09-10 LAB — TSH: TSH: 2.68 u[IU]/mL (ref 0.35–4.50)

## 2015-09-10 LAB — T4, FREE: Free T4: 0.96 ng/dL (ref 0.60–1.60)

## 2015-09-15 ENCOUNTER — Encounter: Payer: Self-pay | Admitting: Internal Medicine

## 2015-09-15 ENCOUNTER — Ambulatory Visit (INDEPENDENT_AMBULATORY_CARE_PROVIDER_SITE_OTHER): Payer: BC Managed Care – PPO | Admitting: Internal Medicine

## 2015-09-15 VITALS — BP 118/64 | HR 72 | Temp 98.1°F | Wt 149.9 lb

## 2015-09-15 DIAGNOSIS — E119 Type 2 diabetes mellitus without complications: Secondary | ICD-10-CM

## 2015-09-15 DIAGNOSIS — E782 Mixed hyperlipidemia: Secondary | ICD-10-CM | POA: Diagnosis not present

## 2015-09-15 DIAGNOSIS — Z8669 Personal history of other diseases of the nervous system and sense organs: Secondary | ICD-10-CM

## 2015-09-15 DIAGNOSIS — I10 Essential (primary) hypertension: Secondary | ICD-10-CM | POA: Diagnosis not present

## 2015-09-15 DIAGNOSIS — Z87898 Personal history of other specified conditions: Secondary | ICD-10-CM

## 2015-09-15 MED ORDER — LISINOPRIL-HYDROCHLOROTHIAZIDE 20-12.5 MG PO TABS: 1.0000 | ORAL_TABLET | Freq: Every day | ORAL | Status: DC

## 2015-09-15 NOTE — Patient Instructions (Addendum)
Your triglycerides are getting better  Keep going.  Ok to change the bp med  To decrease the hctz.  Vertigo  Can come from a lot of reasons .  But exam is reassuring and avoid dehydration.  hga1c  Lab Results  Component Value Date   WBC 6.9 06/19/2014   HGB 13.0 06/19/2014   HCT 38.3 06/19/2014   PLT 226.0 06/19/2014   GLUCOSE 130* 05/23/2015   CHOL 203* 09/10/2015   TRIG 263.0* 09/10/2015   HDL 51.60 09/10/2015   LDLDIRECT 128.0 09/10/2015   LDLCALC 102* 06/19/2014   ALT 28 05/23/2015   AST 19 05/23/2015   NA 134* 05/23/2015   K 3.9 05/23/2015   CL 99 05/23/2015   CREATININE 0.72 05/23/2015   BUN 12 05/23/2015   CO2 29 05/23/2015   TSH 2.68 09/10/2015   INR 1.03 09/12/2011   HGBA1C 6.1 05/23/2015   MICROALBUR 181.0 Repeated and verified X2.* 06/19/2014

## 2015-09-15 NOTE — Progress Notes (Signed)
Pre visit review using our clinic review tool, if applicable. No additional management support is needed unless otherwise documented below in the visit note. 

## 2015-09-15 NOTE — Progress Notes (Signed)
Chief Complaint  Patient presents with  . Follow-up    Triglycerides diabetes.    HPI: Amber [name] 49 y.o.  comes in for chronic disease/ medication management  DM  Sugars good no low s feels better on this med  exercising more.  LIPIDS TG  exercising    Not taking atorva wants to get ldl down with lsi .  bp seems low can she dec the hctz? feesls dry mouth and skin at times    New had 2 episodes of vertigo when tired once when sitting and one wne  Walking to car. No inditors  Lasted minutes no other sx assoc feels okay now. No change in vision hearing head injury both times were when she was tired.  ROS: See pertinent positives and negatives per HPI.  Past Medical History  Diagnosis Date  . Diabetes mellitus   . Hyperlipidemia   . Hypertension   . Anemia     Family History  Problem Relation Age of Onset  . Hypertension Mother   . Diabetes Father   . Diabetes Other     Grandparent  . Stroke Other     Grandparent  . Hyperlipidemia      parents  . Developmental delay Daughter     Social History   Social History  . Marital Status: Married    Spouse Name: N/A  . Number of Children: 1  . Years of Education: BS   Occupational History  . Acupuncturist    Social History Main Topics  . Smoking status: Never Smoker   . Smokeless tobacco: None  . Alcohol Use: No  . Drug Use: No  . Sexual Activity: Yes    Birth Control/ Protection: Pill   Other Topics Concern  . 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     Outpatient Prescriptions Prior to Visit  Medication Sig Dispense Refill  . Coenzyme Q10 (CO Q 10 PO) Take by mouth. Occasionally    . glucose blood test strip Use bid 100 each 12  . Lancets Thin MISC Use bid- pt needs lancets that goes with the contour  machine 100 each 11  . Multiple Vitamin (MULTIVITAMIN) capsule Take 1 capsule by mouth.    . SitaGLIPtin-MetFORMIN HCl 50-1000 MG TB24 Take 2 tablets by mouth daily. 60 tablet 5  . lisinopril-hydrochlorothiazide (PRINZIDE,ZESTORETIC) 20-25 MG per tablet TAKE 1 TABLET BY MOUTH ONCE A DAY 90 tablet 1  . atorvastatin (LIPITOR) 10 MG tablet Take 10 mg by mouth at bedtime.     No facility-administered medications prior to visit.     EXAM:  BP 118/64 mmHg  Pulse 72  Temp(Src) 98.1 F (36.7 C) (Oral)  Wt 149 lb 14.4 oz (67.994 kg)  SpO2 99%  Body mass index is 27.41 kg/(m^2).  GENERAL: vitals reviewed and listed above, alert, oriented, appears well hydrated and in no acute distress HEENT: atraumatic, conjunctiva  clear, no obvious abnormalities on inspection of external nose and ears OP : no lesion edema or exudate TMs are clear OP clear tongue midline. NECK: no obvious masses on inspection palpation  MS: moves all extremities without noticeable focal  abnormality PSYCH: pleasant and cooperative, no obvious depression  or anxiety Neuro non focal nl gait and affect    ASSESSMENT AND PLAN:  Discussed the following assessment and plan:  Elevated cholesterol with high triglycerides - improved  still elevated . Avoiding atorvastatin once to do lifestyle LDL 120s  Diabetes mellitus without complication - controlled without complications lsi with meds  Hx of vertigo - 2 short-lived episodes no associated symptoms are alarm otherwise  Essential hypertension - Controlled okay to decrease HCTZ component because of dry mouth follow-up  -Patient advised to return or notify health care team  if symptoms worsen ,persist or new concerns arise.  Patient Instructions   Your triglycerides are getting better  Keep going.  Ok to change the bp med  To decrease the hctz.  Vertigo  Can come from a lot of reasons .  But exam is reassuring and avoid dehydration.  hga1c  Lab Results  Component Value  Date   WBC 6.9 06/19/2014   HGB 13.0 06/19/2014   HCT 38.3 06/19/2014   PLT 226.0 06/19/2014   GLUCOSE 130* 05/23/2015   CHOL 203* 09/10/2015   TRIG 263.0* 09/10/2015   HDL 51.60 09/10/2015   LDLDIRECT 128.0 09/10/2015   LDLCALC 102* 06/19/2014   ALT 28 05/23/2015   AST 19 05/23/2015   NA 134* 05/23/2015   K 3.9 05/23/2015   CL 99 05/23/2015   CREATININE 0.72 05/23/2015   BUN 12 05/23/2015   CO2 29 05/23/2015   TSH 2.68 09/10/2015   INR 1.03 09/12/2011   HGBA1C 6.1 05/23/2015   MICROALBUR 181.0 Repeated and verified X2.* 06/19/2014       Neta Mends. Anysia Choi M.D.

## 2015-11-07 ENCOUNTER — Ambulatory Visit (INDEPENDENT_AMBULATORY_CARE_PROVIDER_SITE_OTHER): Payer: BC Managed Care – PPO | Admitting: Family Medicine

## 2015-11-07 DIAGNOSIS — Z23 Encounter for immunization: Secondary | ICD-10-CM | POA: Diagnosis not present

## 2015-12-20 ENCOUNTER — Other Ambulatory Visit: Payer: Self-pay | Admitting: Internal Medicine

## 2015-12-23 NOTE — Telephone Encounter (Signed)
Filled for 1 year on 09/15/15.  Declined

## 2016-01-29 ENCOUNTER — Other Ambulatory Visit: Payer: Self-pay | Admitting: Internal Medicine

## 2016-01-29 NOTE — Telephone Encounter (Signed)
Sent to the pharmacy by e-scribe.  Pt has upcoming cpx on 03/08/16

## 2016-03-01 ENCOUNTER — Other Ambulatory Visit (INDEPENDENT_AMBULATORY_CARE_PROVIDER_SITE_OTHER): Payer: BC Managed Care – PPO

## 2016-03-01 DIAGNOSIS — R7989 Other specified abnormal findings of blood chemistry: Secondary | ICD-10-CM | POA: Diagnosis not present

## 2016-03-01 DIAGNOSIS — Z0000 Encounter for general adult medical examination without abnormal findings: Secondary | ICD-10-CM

## 2016-03-01 LAB — HEPATIC FUNCTION PANEL
ALK PHOS: 50 U/L (ref 39–117)
ALT: 17 U/L (ref 0–35)
AST: 15 U/L (ref 0–37)
Albumin: 4.4 g/dL (ref 3.5–5.2)
BILIRUBIN DIRECT: 0.1 mg/dL (ref 0.0–0.3)
BILIRUBIN TOTAL: 0.6 mg/dL (ref 0.2–1.2)
TOTAL PROTEIN: 7.4 g/dL (ref 6.0–8.3)

## 2016-03-01 LAB — CBC WITH DIFFERENTIAL/PLATELET
BASOS ABS: 0 10*3/uL (ref 0.0–0.1)
Basophils Relative: 0.5 % (ref 0.0–3.0)
EOS ABS: 0.1 10*3/uL (ref 0.0–0.7)
EOS PCT: 1.8 % (ref 0.0–5.0)
HCT: 39.7 % (ref 36.0–46.0)
HEMOGLOBIN: 13.7 g/dL (ref 12.0–15.0)
LYMPHS ABS: 2.5 10*3/uL (ref 0.7–4.0)
Lymphocytes Relative: 41.6 % (ref 12.0–46.0)
MCHC: 34.5 g/dL (ref 30.0–36.0)
MCV: 89.6 fl (ref 78.0–100.0)
MONO ABS: 0.3 10*3/uL (ref 0.1–1.0)
Monocytes Relative: 4.8 % (ref 3.0–12.0)
NEUTROS PCT: 51.3 % (ref 43.0–77.0)
Neutro Abs: 3.1 10*3/uL (ref 1.4–7.7)
Platelets: 239 10*3/uL (ref 150.0–400.0)
RBC: 4.44 Mil/uL (ref 3.87–5.11)
RDW: 13.1 % (ref 11.5–15.5)
WBC: 6 10*3/uL (ref 4.0–10.5)

## 2016-03-01 LAB — MICROALBUMIN / CREATININE URINE RATIO
Creatinine,U: 166.1 mg/dL
MICROALB UR: 40.1 mg/dL — AB (ref 0.0–1.9)
Microalb Creat Ratio: 24.1 mg/g (ref 0.0–30.0)

## 2016-03-01 LAB — LIPID PANEL
Cholesterol: 192 mg/dL (ref 0–200)
HDL: 56.6 mg/dL (ref >39.00)
NONHDL: 135.04
Total CHOL/HDL Ratio: 3
Triglycerides: 201 mg/dL — ABNORMAL HIGH (ref 0.0–149.0)
VLDL: 40.2 mg/dL — AB (ref 0.0–40.0)

## 2016-03-01 LAB — TSH: TSH: 2.65 u[IU]/mL (ref 0.35–4.50)

## 2016-03-01 LAB — BASIC METABOLIC PANEL
BUN: 13 mg/dL (ref 6–23)
CALCIUM: 9.7 mg/dL (ref 8.4–10.5)
CO2: 29 mEq/L (ref 19–32)
CREATININE: 0.71 mg/dL (ref 0.40–1.20)
Chloride: 102 mEq/L (ref 96–112)
GFR: 92.59 mL/min (ref >60.00)
GLUCOSE: 126 mg/dL — AB (ref 70–99)
POTASSIUM: 3.9 meq/L (ref 3.5–5.1)
Sodium: 140 mEq/L (ref 135–145)

## 2016-03-01 LAB — HEMOGLOBIN A1C: Hgb A1c MFr Bld: 6.1 % (ref 4.6–6.5)

## 2016-03-01 LAB — LDL CHOLESTEROL, DIRECT: Direct LDL: 107 mg/dL

## 2016-03-07 NOTE — Progress Notes (Signed)
Chief Complaint  Patient presents with  . Annual Exam  . Diabetes    HPI: Patient  Amber [name]  50 y.o. comes in today for Preventive Health Care visit  And Chronic disease management  DM" janu met is a miracle" help bg and weight control  No vision changes concerns .  bp controlled  Lipids not taking  Statin  Heard from friend   abou tposs  memory loss  Never did stress tests last year  No more pain issues   Last pap 2012 had ablation no bleeding  Dr Gaetano Net .  nno fam hx of colon cancer  Pos fam hx of renal disease from dm  Health Maintenance  Topic Date Due  . PNEUMOCOCCAL POLYSACCHARIDE VACCINE (1) 02/08/1968  . OPHTHALMOLOGY EXAM  02/08/1976  . FOOT EXAM  06/25/2015  . PAP SMEAR  07/21/2015  . MAMMOGRAM  02/08/2016  . COLONOSCOPY  02/08/2016  . HIV Screening  03/08/2017 (Originally 02/07/1981)  . INFLUENZA VACCINE  07/20/2016  . HEMOGLOBIN A1C  09/01/2016  . TETANUS/TDAP  06/05/2023   Health Maintenance Review LIFESTYLE:  Exercise:   Walking and weights  Tobacco/ETS:no Alcohol: no Sugar beverages: no Sleep: 7-8 hours  Drug use: no    ROS:  GEN/ HEENT: No fever, significant weight changes sweats headaches vision problems hearing changes, CV/ PULM; No chest pain shortness of breath cough, syncope,edema  change in exercise tolerance. GI /GU: No adominal pain, vomiting, change in bowel habits. No blood in the stool. No significant GU symptoms. SKIN/HEME: ,no acute skin rashes suspicious lesions or bleeding. No lymphadenopathy, nodules, masses.  NEURO/ PSYCH:  No neurologic signs such as weakness numbness. No depression anxiety. IMM/ Allergy: No unusual infections.  Allergy .   REST of 12 system review negative except as per HPI   Past Medical History  Diagnosis Date  . Diabetes mellitus   . Hyperlipidemia   . Hypertension   . Anemia     Past Surgical History  Procedure Laterality Date  . No past surgeries    . Dilation and curettage of uterus   09/2011  . Blood trans  08/2011  . Ablastion  09/2011    Family History  Problem Relation Age of Onset  . Hypertension Mother   . Diabetes Father   . Diabetes Other     Grandparent  . Stroke Other     Grandparent  . Hyperlipidemia      parents  . Developmental delay Daughter     Social History   Social History  . Marital Status: Married    Spouse Name: N/A  . Number of Children: 1  . Years of Education: BS   Occupational History  . Acupuncturist    Social History Main Topics  . Smoking status: Never Smoker   . Smokeless tobacco: None  . Alcohol Use: No  . Drug Use: No  . Sexual Activity: Yes    Birth Control/ Protection: Pill   Other Topics Concern  . 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    G1P1 child has developmental disability  Poss genetically based eval at [facility]   hh of 3  34 hours per week    Neg etsFA   [place] last eye exam .     Outpatient Prescriptions Prior to Visit  Medication Sig Dispense Refill  . glucose blood test strip Use bid 100 each 12  .  JANUMET XR 50-1000 MG TB24 TAKE 2 TABLETS BY MOUTH ONCE A DAY 60 tablet 1  . Lancets Thin MISC Use bid- pt needs lancets that goes with the contour machine 100 each 11  . lisinopril-hydrochlorothiazide (ZESTORETIC) 20-12.5 MG per tablet Take 1 tablet by mouth daily. 90 tablet 3  . Multiple Vitamin (MULTIVITAMIN) capsule Take 1 capsule by mouth.    Marland Kitchen atorvastatin (LIPITOR) 10 MG tablet Take 10 mg by mouth at bedtime.    . Coenzyme Q10 (CO Q 10 PO) Take by mouth. Occasionally     No facility-administered medications prior to visit.     EXAM:  BP 120/76 mmHg  Temp(Src) 98.5 F (36.9 C) (Oral)  Ht 5' 2.5" (1.588 m)  Wt 143 lb (64.864 kg)  BMI 25.72 kg/m2  Body mass index is 25.72 kg/(m^2).  Physical Exam: Vital signs reviewed TSV:XBLT is a well-developed well-nourished alert cooperative    who appearsr stated age in no acute distress.    HEENT: normocephalic atraumatic , Eyes: PERRL EOM's full, conjunctiva clear, Nares: paten,t no deformity discharge or tenderness., Ears: no deformity EAC's clear TMs with normal landmarks. Mouth: clear OP, no lesions, edema.  Moist mucous membranes. Dentition in adequate repair. NECK: supple without masses, thyromegaly or bruits. CHEST/PULM:  Clear to auscultation and percussion breath sounds equal no wheeze , rales or rhonchi. No chest wall deformities or tenderness. CV: PMI is nondisplaced, S1 S2 no gallops, murmurs, rubs. Peripheral pulses are full without delay.No JVD . Breast: normal by inspection . No dimpling, discharge, masses, tenderness or discharge . ABDOMEN: Bowel sounds normal nontender  No guard or rebound, no hepato splenomegal no CVA tenderness.  No hernia. Extremtities:  No clubbing cyanosis or edema, no acute joint swelling or redness no focal atrophy NEURO:  Oriented x3, cranial nerves 3-12 appear to be intact, no obvious focal weakness,gait within normal limits no abnormal reflexes or asymmetrical SKIN: No acute rashes normal turgor, color, no bruising or petechiae. PSYCH: Oriented, good eye contact, no obvious depression anxiety, cognition and judgment appear normal. LN: no cervical axillary inguinal adenopathy Diabetic Foot Exam - Simple   Simple Foot Form  Diabetic Foot exam was performed with the following findings:  Yes 03/08/2016  9:56 AM  Visual Inspection  No deformities, no ulcerations, no other skin breakdown bilaterally:  Yes  Sensation Testing  Intact to touch and monofilament testing bilaterally:  Yes  See comments:  Yes  Pulse Check  Posterior Tibialis and Dorsalis pulse intact bilaterally:  Yes  Comments  r little toe dec sense nolesions     Pelvic: NL ext GU, labia clear without lesions or rash . Vagina no lesions .Cervix: ectopy   Blood with pap cyto brush   UTERUS: Neg CMT Adnexa:  clear no masses . PAP done  With hr hpvrectal no mass heme negative     Lab Results  Component Value Date   WBC 6.0 03/01/2016   HGB 13.7 03/01/2016   HCT 39.7 03/01/2016   PLT 239.0 03/01/2016   GLUCOSE 126* 03/01/2016   CHOL 192 03/01/2016   TRIG 201.0* 03/01/2016   HDL 56.60 03/01/2016   LDLDIRECT 107.0 03/01/2016   LDLCALC 102* 06/19/2014   ALT 17 03/01/2016   AST 15 03/01/2016   NA 140 03/01/2016   K 3.9 03/01/2016   CL 102 03/01/2016   CREATININE 0.71 03/01/2016   BUN 13 03/01/2016   CO2 29 03/01/2016   TSH 2.65 03/01/2016   INR 1.03 09/12/2011   HGBA1C  6.1 03/01/2016   MICROALBUR 40.1* 03/01/2016   BP Readings from Last 3 Encounters:  03/08/16 120/76  09/15/15 118/64  05/30/15 128/86   Wt Readings from Last 3 Encounters:  03/08/16 143 lb (64.864 kg)  09/15/15 149 lb 14.4 oz (67.994 kg)  05/30/15 152 lb 9.6 oz (69.219 kg)      ASSESSMENT AND PLAN:  Discussed the following assessment and plan:  Visit for preventive health examination  Diabetes mellitus without complication ([place]) - controlled  a1c 6.1  Medication management  Essential hypertension  Hyperlipidemia  Colon cancer screening - Plan: Ambulatory referral to Gastroenterology  Encounter for routine gynecological examination - over due   Control much better and lost weight to continues lsi  Disc statin med and dm risk . Etc   rx printed and she will research the issue  Either way fu in  6 months  Lipid a1c.  To get mammogram   Patient Care Team: Burnis Medin, MD as PCP - General (Internal Medicine) Patient Instructions  Blood sugar control is good  Colon cancer screening will do referral Will notify you when pap results are available.  Prevention recommnedations advise  Statin medication  To decrease risk of heart attack and stroke.  Your 10 year risk calculation is only 2.6 % however  high lifetime risk  Let us know if you prefer a different medication that one prescribed ,  Continue lifestyle intervention healthy eating and exercise . As this is  also important for prevention.     Health Maintenance, Female Adopting a healthy lifestyle and getting preventive care can go a long way to promote health and wellness. Talk with your health care provider about what schedule of regular examinations is right for you. This is a good chance for you to check in with your provider about disease prevention and staying healthy. In between checkups, there are plenty of things you can do on your own. Experts have done a lot of research about which lifestyle changes and preventive measures are most likely to keep you healthy. Ask your health care provider for more information. WEIGHT AND DIET  Eat a healthy diet  Be sure to include plenty of vegetables, fruits, low-fat dairy products, and lean protein.  Do not eat a lot of foods high in solid fats, added sugars, or salt.  Get regular exercise. This is one of the most important things you can do for your health.  Most adults should exercise for at least 150 minutes each week. The exercise should increase your heart rate and make you sweat (moderate-intensity exercise).  Most adults should also do strengthening exercises at least twice a week. This is in addition to the moderate-intensity exercise.  Maintain a healthy weight  Body mass index (BMI) is a measurement that can be used to identify possible weight problems. It estimates body fat based on height and weight. Your health care provider can help determine your BMI and help you achieve or maintain a healthy weight.  For females 6 years of age and older:   A BMI below 18.5 is considered underweight.  A BMI of 18.5 to 24.9 is normal.  A BMI of 25 to 29.9 is considered overweight.  A BMI of 30 and above is considered obese.  Watch levels of cholesterol and blood lipids  You should start having your blood tested for lipids and cholesterol at 50 years of age, then have this test every 5 years.  You may need to have your cholesterol  levels  checked more often if:  Your lipid or cholesterol levels are high.  You are older than 50 years of age.  You are at high risk for heart disease.  CANCER SCREENING   Lung Cancer  Lung cancer screening is recommended for adults 61-11 years old who are at high risk for lung cancer because of a history of smoking.  A yearly low-dose CT scan of the lungs is recommended for people who:  Currently smoke.  Have quit within the past 15 years.  Have at least a 30-pack-year history of smoking. A pack year is smoking an average of one pack of cigarettes a day for 1 year.  Yearly screening should continue until it has been 15 years since you quit.  Yearly screening should stop if you develop a health problem that would prevent you from having lung cancer treatment.  Breast Cancer  Practice breast self-awareness. This means understanding how your breasts normally appear and feel.  It also means doing regular breast self-exams. Let your health care provider know about any changes, no matter how small.  If you are in your 20s or 30s, you should have a clinical breast exam (CBE) by a health care provider every 1-3 years as part of a regular health exam.  If you are 68 or older, have a CBE every year. Also consider having a breast X-ray (mammogram) every year.  If you have a family history of breast cancer, talk to your health care provider about genetic screening.  If you are at high risk for breast cancer, talk to your health care provider about having an MRI and a mammogram every year.  Breast cancer gene (BRCA) assessment is recommended for women who have family members with BRCA-related cancers. BRCA-related cancers include:  Breast.  Ovarian.  Tubal.  Peritoneal cancers.  Results of the assessment will determine the need for genetic counseling and BRCA1 and BRCA2 testing. Cervical Cancer Your health care provider may recommend that you be screened regularly for cancer of the  pelvic organs (ovaries, uterus, and vagina). This screening involves a pelvic examination, including checking for microscopic changes to the surface of your cervix (Pap test). You may be encouraged to have this screening done every 3 years, beginning at age 65.  For women ages 65-65, health care providers may recommend pelvic exams and Pap testing every 3 years, or they may recommend the Pap and pelvic exam, combined with testing for human papilloma virus (HPV), every 5 years. Some types of HPV increase your risk of cervical cancer. Testing for HPV may also be done on women of any age with unclear Pap test results.  Other health care providers may not recommend any screening for nonpregnant women who are considered low risk for pelvic cancer and who do not have symptoms. Ask your health care provider if a screening pelvic exam is right for you.  If you have had past treatment for cervical cancer or a condition that could lead to cancer, you need Pap tests and screening for cancer for at least 20 years after your treatment. If Pap tests have been discontinued, your risk factors (such as having a new sexual partner) need to be reassessed to determine if screening should resume. Some women have medical problems that increase the chance of getting cervical cancer. In these cases, your health care provider may recommend more frequent screening and Pap tests. Colorectal Cancer  This type of cancer can be detected and often prevented.  Routine colorectal cancer  screening usually begins at 50 years of age and continues through 50 years of age.  Your health care provider may recommend screening at an earlier age if you have risk factors for colon cancer.  Your health care provider may also recommend using home test kits to check for hidden blood in the stool.  A small camera at the end of a tube can be used to examine your colon directly (sigmoidoscopy or colonoscopy). This is done to check for the earliest  forms of colorectal cancer.  Routine screening usually begins at age 72.  Direct examination of the colon should be repeated every 5-10 years through 50 years of age. However, you may need to be screened more often if early forms of precancerous polyps or small growths are found. Skin Cancer  Check your skin from head to toe regularly.  Tell your health care provider about any new moles or changes in moles, especially if there is a change in a mole's shape or color.  Also tell your health care provider if you have a mole that is larger than the size of a pencil eraser.  Always use sunscreen. Apply sunscreen liberally and repeatedly throughout the day.  Protect yourself by wearing long sleeves, pants, a wide-brimmed hat, and sunglasses whenever you are outside. HEART DISEASE, DIABETES, AND HIGH BLOOD PRESSURE   High blood pressure causes heart disease and increases the risk of stroke. High blood pressure is more likely to develop in:  People who have blood pressure in the high end of the normal range (130-139/85-89 mm Hg).  People who are overweight or obese.  People who are African American.  If you are 23-88 years of age, have your blood pressure checked every 3-5 years. If you are 77 years of age or older, have your blood pressure checked every year. You should have your blood pressure measured twice--once when you are at a hospital or clinic, and once when you are not at a hospital or clinic. Record the average of the two measurements. To check your blood pressure when you are not at a hospital or clinic, you can use:  An automated blood pressure machine at a pharmacy.  A home blood pressure monitor.  If you are between 48 years and 85 years old, ask your health care provider if you should take aspirin to prevent strokes.  Have regular diabetes screenings. This involves taking a blood sample to check your fasting blood sugar level.  If you are at a normal weight and have a low  risk for diabetes, have this test once every three years after 50 years of age.  If you are overweight and have a high risk for diabetes, consider being tested at a younger age or more often. PREVENTING INFECTION  Hepatitis B  If you have a higher risk for hepatitis B, you should be screened for this virus. You are considered at high risk for hepatitis B if:  You were born in a country where hepatitis B is common. Ask your health care provider which countries are considered high risk.  Your parents were born in a high-risk country, and you have not been immunized against hepatitis B (hepatitis B vaccine).  You have HIV or AIDS.  You use needles to inject street drugs.  You live with someone who has hepatitis B.  You have had sex with someone who has hepatitis B.  You get hemodialysis treatment.  You take certain medicines for conditions, including cancer, organ transplantation, and autoimmune  conditions. Hepatitis C  Blood testing is recommended for:  Everyone born from [birth year] through 1965.  Anyone with known risk factors for hepatitis C. Sexually transmitted infections (STIs)  You should be screened for sexually transmitted infections (STIs) including gonorrhea and chlamydia if:  You are sexually active and are younger than 50 years of age.  You are older than 50 years of age and your health care provider tells you that you are at risk for this type of infection.  Your sexual activity has changed since you were last screened and you are at an increased risk for chlamydia or gonorrhea. Ask your health care provider if you are at risk.  If you do not have HIV, but are at risk, it may be recommended that you take a prescription medicine daily to prevent HIV infection. This is called pre-exposure prophylaxis (PrEP). You are considered at risk if:  You are sexually active and do not regularly use condoms or know the HIV status of your partner(s).  You take drugs by  injection.  You are sexually active with a partner who has HIV. Talk with your health care provider about whether you are at high risk of being infected with HIV. If you choose to begin PrEP, you should first be tested for HIV. You should then be tested every 3 months for as long as you are taking PrEP.  PREGNANCY   If you are premenopausal and you may become pregnant, ask your health care provider about preconception counseling.  If you may become pregnant, take 400 to 800 micrograms (mcg) of folic acid every day.  If you want to prevent pregnancy, talk to your health care provider about birth control (contraception). OSTEOPOROSIS AND MENOPAUSE   Osteoporosis is a disease in which the bones lose minerals and strength with aging. This can result in serious bone fractures. Your risk for osteoporosis can be identified using a bone density scan.  If you are 11 years of age or older, or if you are at risk for osteoporosis and fractures, ask your health care provider if you should be screened.  Ask your health care provider whether you should take a calcium or vitamin D supplement to lower your risk for osteoporosis.  Menopause may have certain physical symptoms and risks.  Hormone replacement therapy may reduce some of these symptoms and risks. Talk to your health care provider about whether hormone replacement therapy is right for you.  HOME CARE INSTRUCTIONS   Schedule regular health, dental, and eye exams.  Stay current with your immunizations.   Do not use any tobacco products including cigarettes, chewing tobacco, or electronic cigarettes.  If you are pregnant, do not drink alcohol.  If you are breastfeeding, limit how much and how often you drink alcohol.  Limit alcohol intake to no more than 1 drink per day for nonpregnant women. One drink equals 12 ounces of beer, 5 ounces of wine, or 1 ounces of hard liquor.  Do not use street drugs.  Do not share needles.  Ask your  health care provider for help if you need support or information about quitting drugs.  Tell your health care provider if you often feel depressed.  Tell your health care provider if you have ever been abused or do not feel safe at home.   This information is not intended to replace advice given to you by your health care provider. Make sure you discuss any questions you have with your health care provider.   Document  Released: 06/21/2011 Document Revised: 12/27/2014 Document Reviewed: 11/07/2013 Elsevier Interactive Patient Education 2016 [place] K. Coalton Arch M.D.

## 2016-03-08 ENCOUNTER — Encounter: Payer: Self-pay | Admitting: Internal Medicine

## 2016-03-08 ENCOUNTER — Other Ambulatory Visit (HOSPITAL_COMMUNITY)
Admission: RE | Admit: 2016-03-08 | Discharge: 2016-03-08 | Disposition: A | Discharge status: Home / Self Care | Payer: BC Managed Care – PPO | Source: Ambulatory Visit | Attending: Internal Medicine | Admitting: Internal Medicine

## 2016-03-08 ENCOUNTER — Ambulatory Visit (INDEPENDENT_AMBULATORY_CARE_PROVIDER_SITE_OTHER): Payer: BC Managed Care – PPO | Admitting: Internal Medicine

## 2016-03-08 VITALS — BP 120/76 | Temp 98.5°F | Ht 62.5 in | Wt 143.0 lb

## 2016-03-08 DIAGNOSIS — Z01419 Encounter for gynecological examination (general) (routine) without abnormal findings: Secondary | ICD-10-CM | POA: Insufficient documentation

## 2016-03-08 DIAGNOSIS — Z1151 Encounter for screening for human papillomavirus (HPV): Secondary | ICD-10-CM | POA: Diagnosis not present

## 2016-03-08 DIAGNOSIS — I10 Essential (primary) hypertension: Secondary | ICD-10-CM

## 2016-03-08 DIAGNOSIS — Z79899 Other long term (current) drug therapy: Secondary | ICD-10-CM

## 2016-03-08 DIAGNOSIS — E119 Type 2 diabetes mellitus without complications: Secondary | ICD-10-CM | POA: Diagnosis not present

## 2016-03-08 DIAGNOSIS — E785 Hyperlipidemia, unspecified: Secondary | ICD-10-CM

## 2016-03-08 DIAGNOSIS — Z0000 Encounter for general adult medical examination without abnormal findings: Secondary | ICD-10-CM

## 2016-03-08 DIAGNOSIS — Z1211 Encounter for screening for malignant neoplasm of colon: Secondary | ICD-10-CM

## 2016-03-08 MED ORDER — ATORVASTATIN CALCIUM 10 MG PO TABS: 10.0000 mg | ORAL_TABLET | Freq: Every day | ORAL | Status: DC

## 2016-03-08 NOTE — Addendum Note (Signed)
Addended by: Raj JanusADKINS, Raynesha Tiedt T on: 03/08/2016 11:32 AM   Modules accepted: Orders, SmartSet

## 2016-03-08 NOTE — Patient Instructions (Addendum)
Blood sugar control is good  Colon cancer screening will do referral Will notify you when pap results are available.  Prevention recommnedations advise  Statin medication  To decrease risk of heart attack and stroke.  Your 10 year risk calculation is only 2.6 % however  high lifetime risk  Let us know if you prefer a different medication that one prescribed ,  Continue lifestyle intervention healthy eating and exercise . As this is also important for prevention.     Health Maintenance, Female Adopting a healthy lifestyle and getting preventive care can go a long way to promote health and wellness. Talk with your health care provider about what schedule of regular examinations is right for you. This is a good chance for you to check in with your provider about disease prevention and staying healthy. In between checkups, there are plenty of things you can do on your own. Experts have done a lot of research about which lifestyle changes and preventive measures are most likely to keep you healthy. Ask your health care provider for more information. WEIGHT AND DIET  Eat a healthy diet  Be sure to include plenty of vegetables, fruits, low-fat dairy products, and lean protein.  Do not eat a lot of foods high in solid fats, added sugars, or salt.  Get regular exercise. This is one of the most important things you can do for your health.  Most adults should exercise for at least 150 minutes each week. The exercise should increase your heart rate and make you sweat (moderate-intensity exercise).  Most adults should also do strengthening exercises at least twice a week. This is in addition to the moderate-intensity exercise.  Maintain a healthy weight  Body mass index (BMI) is a measurement that can be used to identify possible weight problems. It estimates body fat based on height and weight. Your health care provider can help determine your BMI and help you achieve or maintain a healthy  weight.  For females 60 years of age and older:   A BMI below 18.5 is considered underweight.  A BMI of 18.5 to 24.9 is normal.  A BMI of 25 to 29.9 is considered overweight.  A BMI of 30 and above is considered obese.  Watch levels of cholesterol and blood lipids  You should start having your blood tested for lipids and cholesterol at 50 years of age, then have this test every 5 years.  You may need to have your cholesterol levels checked more often if:  Your lipid or cholesterol levels are high.  You are older than 50 years of age.  You are at high risk for heart disease.  CANCER SCREENING   Lung Cancer  Lung cancer screening is recommended for adults 38-75 years old who are at high risk for lung cancer because of a history of smoking.  A yearly low-dose CT scan of the lungs is recommended for people who:  Currently smoke.  Have quit within the past 15 years.  Have at least a 30-pack-year history of smoking. A pack year is smoking an average of one pack of cigarettes a day for 1 year.  Yearly screening should continue until it has been 15 years since you quit.  Yearly screening should stop if you develop a health problem that would prevent you from having lung cancer treatment.  Breast Cancer  Practice breast self-awareness. This means understanding how your breasts normally appear and feel.  It also means doing regular breast self-exams. Let your health  care provider know about any changes, no matter how small.  If you are in your 20s or 30s, you should have a clinical breast exam (CBE) by a health care provider every 1-3 years as part of a regular health exam.  If you are 43 or older, have a CBE every year. Also consider having a breast X-ray (mammogram) every year.  If you have a family history of breast cancer, talk to your health care provider about genetic screening.  If you are at high risk for breast cancer, talk to your health care provider about  having an MRI and a mammogram every year.  Breast cancer gene (BRCA) assessment is recommended for women who have family members with BRCA-related cancers. BRCA-related cancers include:  Breast.  Ovarian.  Tubal.  Peritoneal cancers.  Results of the assessment will determine the need for genetic counseling and BRCA1 and BRCA2 testing. Cervical Cancer Your health care provider may recommend that you be screened regularly for cancer of the pelvic organs (ovaries, uterus, and vagina). This screening involves a pelvic examination, including checking for microscopic changes to the surface of your cervix (Pap test). You may be encouraged to have this screening done every 3 years, beginning at age 77.  For women ages 70-65, health care providers may recommend pelvic exams and Pap testing every 3 years, or they may recommend the Pap and pelvic exam, combined with testing for human papilloma virus (HPV), every 5 years. Some types of HPV increase your risk of cervical cancer. Testing for HPV may also be done on women of any age with unclear Pap test results.  Other health care providers may not recommend any screening for nonpregnant women who are considered low risk for pelvic cancer and who do not have symptoms. Ask your health care provider if a screening pelvic exam is right for you.  If you have had past treatment for cervical cancer or a condition that could lead to cancer, you need Pap tests and screening for cancer for at least 20 years after your treatment. If Pap tests have been discontinued, your risk factors (such as having a new sexual partner) need to be reassessed to determine if screening should resume. Some women have medical problems that increase the chance of getting cervical cancer. In these cases, your health care provider may recommend more frequent screening and Pap tests. Colorectal Cancer  This type of cancer can be detected and often prevented.  Routine colorectal cancer  screening usually begins at 50 years of age and continues through 50 years of age.  Your health care provider may recommend screening at an earlier age if you have risk factors for colon cancer.  Your health care provider may also recommend using home test kits to check for hidden blood in the stool.  A small camera at the end of a tube can be used to examine your colon directly (sigmoidoscopy or colonoscopy). This is done to check for the earliest forms of colorectal cancer.  Routine screening usually begins at age 20.  Direct examination of the colon should be repeated every 5-10 years through 50 years of age. However, you may need to be screened more often if early forms of precancerous polyps or small growths are found. Skin Cancer  Check your skin from head to toe regularly.  Tell your health care provider about any new moles or changes in moles, especially if there is a change in a mole's shape or color.  Also tell your health care  provider if you have a mole that is larger than the size of a pencil eraser.  Always use sunscreen. Apply sunscreen liberally and repeatedly throughout the day.  Protect yourself by wearing long sleeves, pants, a wide-brimmed hat, and sunglasses whenever you are outside. HEART DISEASE, DIABETES, AND HIGH BLOOD PRESSURE   High blood pressure causes heart disease and increases the risk of stroke. High blood pressure is more likely to develop in:  People who have blood pressure in the high end of the normal range (130-139/85-89 mm Hg).  People who are overweight or obese.  People who are African American.  If you are 23-38 years of age, have your blood pressure checked every 3-5 years. If you are 77 years of age or older, have your blood pressure checked every year. You should have your blood pressure measured twice--once when you are at a hospital or clinic, and once when you are not at a hospital or clinic. Record the average of the two measurements.  To check your blood pressure when you are not at a hospital or clinic, you can use:  An automated blood pressure machine at a pharmacy.  A home blood pressure monitor.  If you are between 22 years and 53 years old, ask your health care provider if you should take aspirin to prevent strokes.  Have regular diabetes screenings. This involves taking a blood sample to check your fasting blood sugar level.  If you are at a normal weight and have a low risk for diabetes, have this test once every three years after 50 years of age.  If you are overweight and have a high risk for diabetes, consider being tested at a younger age or more often. PREVENTING INFECTION  Hepatitis B  If you have a higher risk for hepatitis B, you should be screened for this virus. You are considered at high risk for hepatitis B if:  You were born in a country where hepatitis B is common. Ask your health care provider which countries are considered high risk.  Your parents were born in a high-risk country, and you have not been immunized against hepatitis B (hepatitis B vaccine).  You have HIV or AIDS.  You use needles to inject street drugs.  You live with someone who has hepatitis B.  You have had sex with someone who has hepatitis B.  You get hemodialysis treatment.  You take certain medicines for conditions, including cancer, organ transplantation, and autoimmune conditions. Hepatitis C  Blood testing is recommended for:  Everyone born from [birth year] through 1965.  Anyone with known risk factors for hepatitis C. Sexually transmitted infections (STIs)  You should be screened for sexually transmitted infections (STIs) including gonorrhea and chlamydia if:  You are sexually active and are younger than 50 years of age.  You are older than 50 years of age and your health care provider tells you that you are at risk for this type of infection.  Your sexual activity has changed since you were last screened and  you are at an increased risk for chlamydia or gonorrhea. Ask your health care provider if you are at risk.  If you do not have HIV, but are at risk, it may be recommended that you take a prescription medicine daily to prevent HIV infection. This is called pre-exposure prophylaxis (PrEP). You are considered at risk if:  You are sexually active and do not regularly use condoms or know the HIV status of your partner(s).  You take drugs  by injection.  You are sexually active with a partner who has HIV. Talk with your health care provider about whether you are at high risk of being infected with HIV. If you choose to begin PrEP, you should first be tested for HIV. You should then be tested every 3 months for as long as you are taking PrEP.  PREGNANCY   If you are premenopausal and you may become pregnant, ask your health care provider about preconception counseling.  If you may become pregnant, take 400 to 800 micrograms (mcg) of folic acid every day.  If you want to prevent pregnancy, talk to your health care provider about birth control (contraception). OSTEOPOROSIS AND MENOPAUSE   Osteoporosis is a disease in which the bones lose minerals and strength with aging. This can result in serious bone fractures. Your risk for osteoporosis can be identified using a bone density scan.  If you are 50 years of age or older, or if you are at risk for osteoporosis and fractures, ask your health care provider if you should be screened.  Ask your health care provider whether you should take a calcium or vitamin D supplement to lower your risk for osteoporosis.  Menopause may have certain physical symptoms and risks.  Hormone replacement therapy may reduce some of these symptoms and risks. Talk to your health care provider about whether hormone replacement therapy is right for you.  HOME CARE INSTRUCTIONS   Schedule regular health, dental, and eye exams.  Stay current with your immunizations.   Do  not use any tobacco products including cigarettes, chewing tobacco, or electronic cigarettes.  If you are pregnant, do not drink alcohol.  If you are breastfeeding, limit how much and how often you drink alcohol.  Limit alcohol intake to no more than 1 drink per day for nonpregnant women. One drink equals 12 ounces of beer, 5 ounces of wine, or 1 ounces of hard liquor.  Do not use street drugs.  Do not share needles.  Ask your health care provider for help if you need support or information about quitting drugs.  Tell your health care provider if you often feel depressed.  Tell your health care provider if you have ever been abused or do not feel safe at home.   This information is not intended to replace advice given to you by your health care provider. Make sure you discuss any questions you have with your health care provider.   Document Released: 06/21/2011 Document Revised: 12/27/2014 Document Reviewed: 11/07/2013 Elsevier Interactive Patient Education [employer].

## 2016-03-10 LAB — CYTOLOGY - PAP

## 2016-03-11 NOTE — Progress Notes (Signed)
Quick Note:  Tell patient PAP is normal. HPV high risk is negative next pap due 5 years or thereabouts ______

## 2016-03-12 ENCOUNTER — Encounter: Payer: Self-pay | Admitting: Family Medicine

## 2016-05-10 ENCOUNTER — Other Ambulatory Visit: Payer: Self-pay | Admitting: Internal Medicine

## 2016-05-10 NOTE — Telephone Encounter (Signed)
Sent to the pharmacy by e-scribe. 

## 2016-07-02 ENCOUNTER — Ambulatory Visit (INDEPENDENT_AMBULATORY_CARE_PROVIDER_SITE_OTHER): Payer: BC Managed Care – PPO | Admitting: Internal Medicine

## 2016-07-02 ENCOUNTER — Encounter: Payer: Self-pay | Admitting: Internal Medicine

## 2016-07-02 VITALS — BP 120/70 | HR 64 | Temp 98.1°F | Ht 63.25 in | Wt 145.0 lb

## 2016-07-02 DIAGNOSIS — E119 Type 2 diabetes mellitus without complications: Secondary | ICD-10-CM

## 2016-07-02 DIAGNOSIS — E785 Hyperlipidemia, unspecified: Secondary | ICD-10-CM

## 2016-07-02 LAB — POCT GLYCOSYLATED HEMOGLOBIN (HGB A1C): Hemoglobin A1C: 5.8

## 2016-07-02 MED ORDER — METFORMIN HCL ER 500 MG PO TB24: 1000.0000 mg | ORAL_TABLET | Freq: Every day | ORAL | Status: DC

## 2016-07-02 MED ORDER — PITAVASTATIN CALCIUM 2 MG PO TABS: ORAL_TABLET | ORAL | Status: DC

## 2016-07-02 NOTE — Patient Instructions (Signed)
Please stop JanuMet XR and start metformin ER 1000 mg with dinner.  Start Livalo 2 mg daily at bedtime.  Please let me know if the sugars are consistently <80 or >200.  Please return in 1.5 months with your sugar log.   PATIENT INSTRUCTIONS FOR TYPE 2 DIABETES:  **Please join MyChart!** - see attached instructions about how to join if you have not done so already.  DIET AND EXERCISE Diet and exercise is an important part of diabetic treatment.  We recommended aerobic exercise in the form of brisk walking (working between 40-60% of maximal aerobic capacity, similar to brisk walking) for 150 minutes per week (such as 30 minutes five days per week) along with 3 times per week performing 'resistance' training (using various gauge rubber tubes with handles) 5-10 exercises involving the major muscle groups (upper body, lower body and core) performing 10-15 repetitions (or near fatigue) each exercise. Start at half the above goal but build slowly to reach the above goals. If limited by weight, joint pain, or disability, we recommend daily walking in a swimming pool with water up to waist to reduce pressure from joints while allow for adequate exercise.    BLOOD GLUCOSES Monitoring your blood glucoses is important for continued management of your diabetes. Please check your blood glucoses 2-4 times a day: fasting, before meals and at bedtime (you can rotate these measurements - e.g. one day check before the 3 meals, the next day check before 2 of the meals and before bedtime, etc.).   HYPOGLYCEMIA (low blood sugar) Hypoglycemia is usually a reaction to not eating, exercising, or taking too much insulin/ other diabetes drugs.  Symptoms include tremors, sweating, hunger, confusion, headache, etc. Treat IMMEDIATELY with 15 grams of Carbs: . 4 glucose tablets .  cup regular juice/soda . 2 tablespoons raisins . 4 teaspoons sugar . 1 tablespoon honey Recheck blood glucose in 15 mins and repeat above  if still symptomatic/blood glucose <100.  RECOMMENDATIONS TO REDUCE YOUR RISK OF DIABETIC COMPLICATIONS: * Take your prescribed MEDICATION(S) * Follow a DIABETIC diet: Complex carbs, fiber rich foods, (monounsaturated and polyunsaturated) fats * AVOID saturated/trans fats, high fat foods, >2,300 mg salt per day. * EXERCISE at least 5 times a week for 30 minutes or preferably daily.  * DO NOT SMOKE OR DRINK more than 1 drink a day. * Check your FEET every day. Do not wear tightfitting shoes. [place] if you develop an ulcer * See your EYE doctor once a year or more if needed * Get a FLU shot once a year * Get a PNEUMONIA vaccine once before and once after age 50 years  GOALS:  * Your Hemoglobin A1c of <7%  * fasting sugars need to be <130 * after meals sugars need to be <180 (2h after you start eating) * Your Systolic BP should be 140 or lower  * Your Diastolic BP should be 80 or lower  * Your HDL (Good Cholesterol) should be 40 or higher  * Your LDL (Bad Cholesterol) should be 100 or lower. * Your Triglycerides should be 150 or lower  * Your Urine microalbumin (kidney function) should be <30 * Your Body Mass Index should be 25 or lower    Please consider the following ways to cut down carbs and fat and increase fiber and micronutrients in your diet: - substitute whole grain for white bread or pasta - substitute brown rice for white rice - substitute 90-calorie flat bread pieces for slices of bread  when possible - substitute sweet potatoes or yams for white potatoes - substitute humus for margarine - substitute tofu for cheese when possible - substitute almond or rice milk for regular milk (would not drink soy milk daily due to concern for soy estrogen influence on breast cancer risk) - substitute dark chocolate for other sweets when possible - substitute water - can add lemon or orange slices for taste - for diet sodas (artificial sweeteners will trick your body that you can eat  sweets without getting calories and will lead you to overeating and weight gain in the long run) - do not skip breakfast or other meals (this will slow down the metabolism and will result in more weight gain over time)  - can try smoothies made from fruit and almond/rice milk in am instead of regular breakfast - can also try old-fashioned (not instant) oatmeal made with almond/rice milk in am - order the dressing on the side when eating salad at a restaurant (pour less than half of the dressing on the salad) - eat as little meat as possible - can try juicing, but should not forget that juicing will get rid of the fiber, so would alternate with eating raw veg./fruits or drinking smoothies - use as little oil as possible, even when using olive oil - can dress a salad with a mix of balsamic vinegar and lemon juice, for e.g. - use agave nectar, stevia sugar, or regular sugar rather than artificial sweateners - steam or broil/roast veggies  - snack on veggies/fruit/nuts (unsalted, preferably) when possible, rather than processed foods - reduce or eliminate aspartame in diet (it is in diet sodas, chewing gum, etc) Read the labels!  Try to read Dr. Janene Harvey book: "Program for Reversing Diabetes" for other ideas for healthy eating.

## 2016-07-02 NOTE — Progress Notes (Signed)
Patient ID: Amber [name], female   DOB: 1966/10/30, 50 y.o.   MRN: [id number]  HPI: Amber Riddleing Llanas is a 50 y.o.-year-old female, self-referred, for management of DM2, dx in 2003, non-insulin-dependent, controlled, without complications. She saw Dr. Everardo AllEllison before, last visit in 2013.  Last hemoglobin A1c was: Lab Results  Component Value Date   HGBA1C 6.1 03/01/2016   HGBA1C 6.1 05/23/2015   HGBA1C 6.8* 02/14/2015   Pt is on a regimen of: - JanuMet XR 50-1000 mg with lunch 2/2 feeling bloated if takes it in am. She c/o vertigo with 2 tabs of JanuMet a day >> decreased to 1 tab a day >> resolved vertigo. Now has tinnitus and hearing loss. She was on Actos >> wt gain, swelling, diaphoresis She was on Regular Metformin for a long time with good results but started to have fatigue, diarrhea.  Pt checks her sugars infrequently: - am: 110-120 - 2h after b'fast: n/c - before lunch: n/c - 2h after lunch: 130-140 - before dinner: n/c - 2h after dinner: n/c - bedtime: n/c - nighttime: n/c No lows. Lowest sugar was 118; ? hypoglycemia awareness. Highest sugar was 140  Glucometer: Bayer Contour  Pt's meals are: - Breakfast: coffee + banana or yoghurt or an egg - Lunch: sandwich or chinese meal - Dinner: meat + veggies + quinoa or brown rice + sweets - Snacks: no  For exercise, she is walking 3-4 times a week and doing weightlifting 1-2 hours per week.  - no CKD, last BUN/creatinine:  Lab Results  Component Value Date   BUN 13 03/01/2016   BUN 12 05/23/2015   CREATININE 0.71 03/01/2016   CREATININE 0.72 05/23/2015  On Lisinopril. - last set of lipids: Lab Results  Component Value Date   CHOL 192 03/01/2016   HDL 56.60 03/01/2016   LDLCALC 102* 06/19/2014   LDLDIRECT 107.0 03/01/2016   TRIG 201.0* 03/01/2016   CHOLHDL 3 03/01/2016  PCP suggested Lipitor. She was afraid to start b/c her friend experienced memory loss. - last eye exam was >10 years ago. No DR.  - + numbness and tingling  in her lateral side or R foot - "for years"  Pt has FH of DM in father, PGM.  She also has a history of HTN, HL.  ROS: Constitutional: no weight gain/loss, + increased appetite, + fatigue, no subjective hyperthermia/hypothermia Eyes: no blurry vision, no xerophthalmia ENT: no sore throat, no nodules palpated in throat, no dysphagia/odynophagia, no hoarseness, + hypoacusis, + tinnitus Cardiovascular: no CP/SOB/palpitations/leg swelling Respiratory: no cough/SOB Gastrointestinal: + N/no V/D/+ C Musculoskeletal: + muscle/no joint aches Skin: no rashes, + hair loss Neurological: no tremors/numbness/tingling/dizziness, + headache Psychiatric: no depression/anxiety  Past Medical History  Diagnosis Date  . Diabetes mellitus   . Hyperlipidemia   . Hypertension   . Anemia    Past Surgical History  Procedure Laterality Date  . No past surgeries    . Dilation and curettage of uterus  09/2011  . Blood trans  08/2011  . Ablastion  09/2011   Social History   Social History  . Marital Status: Married    Spouse Name: N/A  . Number of Children: 1  . Years of Education: BS   Occupational History  . Acupuncturist    Social History Main Topics  . Smoking status: Never Smoker   . Smokeless tobacco: Not on file  . Alcohol Use: No  . Drug Use: No  . Sexual Activity: Yes    Birth Control/ Protection: Pill  Other Topics Concern  . 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    Current Outpatient Prescriptions on File Prior to Visit  Medication Sig Dispense Refill  . glucose blood test strip Use bid 100 each 12  . Lancets Thin MISC Use bid- pt needs lancets that goes with the contour machine 100 each 11  . Multiple Vitamin (MULTIVITAMIN) capsule Take 1 capsule by mouth.     Marland Kitchen atorvastatin (LIPITOR) 10 MG tablet Take 1 tablet (10 mg total) by mouth at bedtime. (Patient not taking: Reported on 07/02/2016) 30 tablet 3  . [DISCONTINUED] IRON PO Take 1 tablet by mouth 3 (three) times daily after meals.      . [DISCONTINUED] Norethin-Eth Estrad-Fe Biphas (LO LOESTRIN FE PO) Take 1 tablet by mouth 2 (two) times daily.      . [DISCONTINUED] pioglitazone (ACTOS) 30 MG tablet Take 30 mg by mouth daily.       No current facility-administered medications on file prior to visit.   Allergies  Allergen Reactions  . Actos [Pioglitazone]     Weight gain  . Other Rash    Flu vaccine   Family History  Problem Relation Age of Onset  . Hypertension Mother   . Diabetes Father   . Diabetes Other     Grandparent  . Stroke Other     Grandparent  . Hyperlipidemia      parents  . Developmental delay Daughter    PE: BP 120/70 mmHg  Pulse 64  Temp(Src) 98.1 F (36.7 C) (Oral)  Ht 5' 3.25" (1.607 m)  Wt 145 lb (65.772 kg)  BMI 25.47 kg/m2  SpO2 98% Wt Readings from Last 3 Encounters:  07/02/16 145 lb (65.772 kg)  03/08/16 143 lb (64.864 kg)  09/15/15 149 lb 14.4 oz (67.994 kg)   Constitutional: overweight, in NAD Eyes: PERRLA, EOMI, no exophthalmos ENT: moist mucous membranes, no thyromegaly, no cervical lymphadenopathy Cardiovascular: RRR, No MRG Respiratory: CTA B Gastrointestinal: abdomen soft, NT, ND, BS+ Musculoskeletal: no deformities, strength intact in all 4 Skin: moist, warm, no rashes Neurological: no tremor with outstretched hands, DTR normal in all 4  ASSESSMENT: 1. DM2, non-insulin-dependent, controlled, without complications  2. Hyperlipidemia  PLAN:  1. Patient with long-standing, well controlled diabetes, on oral antidiabetic regimen (Janumet), which is working very well for her. However, she is wondering whether her tinnitus and previous episode of vertigo are caused by the Janumet. We discussed about the fact that this is unlikely, but  decided to try to take off Januvia and continue only with metformin extended-release to see if her symptoms improve. I doubt that the symptoms are related to the medicines, however, with the vertigo, tinnitus, and hearing loss, I suspect that she may have Mnire's disease. I advised her to discuss with PCP about this. - Her HbA1c today is 5.8%, which is wonderful! She tells me that the half maximum dose of metformin did not work well for her in the past, however, she did lose 10 pounds since then, so her insulin sensitivity may have increased. - We'll start just metformin extended-release 1000 mg with dinner, and if we need to add Januvia in the future, we can  add this in the morning, before breakfast. Alternatively, we can restart Janumet, although I feel that splitting the combination pill would be a better regimen. - I suggested to:  Patient Instructions  Please stop JanuMet XR and start metformin ER 1000 mg with dinner.  Start Livalo 2 mg daily at bedtime.  Please let me know if the sugars are consistently <80 or >200.  Please return in 1.5 months with your sugar log.   - Strongly advised her to start checking sugars at different times of the day - check 1 times a day, rotating checks - given sugar log and advised how to fill it and to bring it at next appt  - given foot care handout and explained the principles  - given instructions for hypoglycemia management "15-15 rule"  - advised for yearly eye exams >> She needs one! 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  - Return to clinic in 1.5 mo with sugar log   2. Hyperlipidemia - We reviewed together her previous lipid profile. LDL is above goal. PCP recommended a statin, but she was reticent to try due to memory loss in her friend, when she started to take a statin. I advised her the studies did not show consistent relationship between statins and memory loss.  - She agreed to start Livalo 2 mg nightly   Carlus Pavlov, MD  PhD [facility]

## 2016-07-02 NOTE — Progress Notes (Signed)
Pre visit review using our clinic review tool, if applicable. No additional management support is needed unless otherwise documented below in the visit note. 

## 2016-08-11 ENCOUNTER — Ambulatory Visit: Payer: BC Managed Care – PPO | Admitting: Internal Medicine

## 2016-09-20 ENCOUNTER — Other Ambulatory Visit: Payer: Self-pay | Admitting: Internal Medicine

## 2016-09-20 DIAGNOSIS — E119 Type 2 diabetes mellitus without complications: Secondary | ICD-10-CM

## 2016-09-21 ENCOUNTER — Other Ambulatory Visit: Payer: Self-pay | Admitting: Family Medicine

## 2016-09-21 ENCOUNTER — Telehealth: Payer: Self-pay | Admitting: Family Medicine

## 2016-09-21 DIAGNOSIS — E785 Hyperlipidemia, unspecified: Secondary | ICD-10-CM

## 2016-09-21 NOTE — Telephone Encounter (Signed)
Sent to the pharmacy by e-scribe for 90 days.  Pt is now past due for lab work and follow up. Message sent to scheduling.  Now seeing Dr. Elvera LennoxGherghe for diabetes control.  No A1C ordered.

## 2016-09-21 NOTE — Telephone Encounter (Signed)
Pt is past due for lipid panel (fasting) and a follow up with Dr. Fabian SharpPanosh.  Please help her to schedule both appointments.  Lab order has been placed.  Thanks!!

## 2016-09-22 NOTE — Telephone Encounter (Signed)
lmom for pt to call back

## 2016-09-28 NOTE — Telephone Encounter (Signed)
lmom for pt to call back

## 2016-10-01 NOTE — Telephone Encounter (Signed)
Pt has been sch

## 2016-10-20 ENCOUNTER — Other Ambulatory Visit (INDEPENDENT_AMBULATORY_CARE_PROVIDER_SITE_OTHER): Payer: BC Managed Care – PPO

## 2016-10-20 DIAGNOSIS — E785 Hyperlipidemia, unspecified: Secondary | ICD-10-CM | POA: Diagnosis not present

## 2016-10-20 LAB — LIPID PANEL
CHOL/HDL RATIO: 4
Cholesterol: 210 mg/dL — ABNORMAL HIGH (ref 0–200)
HDL: 55.8 mg/dL (ref >39.00)
NonHDL: 153.81
Triglycerides: 246 mg/dL — ABNORMAL HIGH (ref 0.0–149.0)
VLDL: 49.2 mg/dL — AB (ref 0.0–40.0)

## 2016-10-20 LAB — LDL CHOLESTEROL, DIRECT: LDL DIRECT: 118 mg/dL

## 2016-10-22 ENCOUNTER — Ambulatory Visit: Payer: BC Managed Care – PPO | Admitting: Internal Medicine

## 2016-10-29 ENCOUNTER — Encounter: Payer: Self-pay | Admitting: Internal Medicine

## 2016-10-29 ENCOUNTER — Ambulatory Visit (INDEPENDENT_AMBULATORY_CARE_PROVIDER_SITE_OTHER): Payer: BC Managed Care – PPO | Admitting: Internal Medicine

## 2016-10-29 VITALS — BP 138/82 | Temp 98.5°F | Wt 144.4 lb

## 2016-10-29 DIAGNOSIS — Z23 Encounter for immunization: Secondary | ICD-10-CM | POA: Diagnosis not present

## 2016-10-29 DIAGNOSIS — Z1211 Encounter for screening for malignant neoplasm of colon: Secondary | ICD-10-CM

## 2016-10-29 DIAGNOSIS — R0789 Other chest pain: Secondary | ICD-10-CM

## 2016-10-29 DIAGNOSIS — E119 Type 2 diabetes mellitus without complications: Secondary | ICD-10-CM

## 2016-10-29 DIAGNOSIS — E785 Hyperlipidemia, unspecified: Secondary | ICD-10-CM | POA: Diagnosis not present

## 2016-10-29 DIAGNOSIS — I10 Essential (primary) hypertension: Secondary | ICD-10-CM | POA: Diagnosis not present

## 2016-10-29 LAB — POCT GLYCOSYLATED HEMOGLOBIN (HGB A1C): HEMOGLOBIN A1C: 5.8

## 2016-10-29 MED ORDER — PITAVASTATIN CALCIUM 2 MG PO TABS: ORAL_TABLET | ORAL | 5 refills | Status: DC

## 2016-10-29 NOTE — Progress Notes (Signed)
Chief Complaint  Patient presents with  . Follow-up    HPI: Amber [name] 50 y.o.  Chronic disease management 6 months  Has lost 10 # lsi   Still ocass cp some neck thoracic   One time pressure in front after working long hours   Took moms nitro and helped. Never got stress test 2015 cause thought woudnl show anuthing causewasnt having cp Didn't try statin med or  livalo  rx dr Reece AgarG cause of dec hyeprglycmeic effedts   Wanted to see lipids first .  Bp up in and 14+ and fine rest of day  Exercises some no assoc sx  ROS: See pertinent positives and negatives per HPI. No spec numbness  Gets boil on backside off and on fsince teen  Inc with sugar going down  chek this  Needs mammo and colonoscopy referral?   Planning to do now  Back on janumet   and feels does well with this.  Not sure she is going back to dr G  For now .   Past Medical History:  Diagnosis Date  . Anemia   . Diabetes mellitus   . Hyperlipidemia   . Hypertension     Family History  Problem Relation Age of Onset  . Hypertension Mother   . Diabetes Father   . Diabetes Other     Grandparent  . Stroke Other     Grandparent  . Hyperlipidemia      parents  . Developmental delay Daughter     Social History   Social History  . Marital status: Married    Spouse name: N/A  . Number of children: 1  . Years of education: BS   Occupational History  . Acupuncturist    Social History Main Topics  . Smoking status: Never Smoker  . Smokeless tobacco: None  . Alcohol use No  . Drug use: No  . Sexual activity: Yes    Birth control/ protection: Pill   Other Topics Concern  . 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     Outpatient Medications Prior to Visit  Medication Sig Dispense Refill  . glucose blood test  strip Use bid 100 each 12  . Lancets Thin MISC Use bid- pt needs lancets that goes with the contour machine 100 each 11  . lisinopril-hydrochlorothiazide (PRINZIDE,ZESTORETIC) 20-25 MG tablet TAKE 1 TABLET BY MOUTH ONCE A DAY 90 tablet 0  . Multiple Vitamin (MULTIVITAMIN) capsule Take 1 capsule by mouth.    Marland Kitchen. atorvastatin (LIPITOR) 10 MG tablet Take 1 tablet (10 mg total) by mouth at bedtime. (Patient not taking: Reported on 07/02/2016) 30 tablet 3  . metFORMIN (GLUCOPHAGE-XR) 500 MG 24 hr tablet Take 2 tablets (1,000 mg total) by mouth daily with supper. 180 tablet 3  . Pitavastatin Calcium 2 MG TABS Take 2 mg in the evening 30 tablet 5   No facility-administered medications prior to visit.      EXAM:  BP 138/82   Temp 98.5 F (36.9 C) (Oral)   Wt 144 lb 6.4 oz (65.5 kg)   BMI 25.38 kg/m   Body mass index is 25.38 kg/m.  GENERAL: vitals reviewed and listed above, alert, oriented,  appears well hydrated and in no acute distress HEENT: atraumatic, conjunctiva  clear, no obvious abnormalities on inspection of external nose and ears  NECK: no obvious masses on inspection palpation  LUNGS: clear to auscultation bilaterally, no wheezes, rales or rhonchi, good air movement CV: HRRR, no clubbing cyanosis or  peripheral edema nl cap refill  Abdomen:  Sof,t normal bowel sounds without hepatosplenomegaly, no guarding rebound or masses no CVA tenderness MS: moves all extremities without noticeable focal  Abnormality Skin right  Thigh gluteal with 2 cn pink cystic area mil tender   No fluctuance  PSYCH: pleasant and cooperative, no obvious depression or anxiety Lab Results  Component Value Date   WBC 6.0 03/01/2016   HGB 13.7 03/01/2016   HCT 39.7 03/01/2016   PLT 239.0 03/01/2016   GLUCOSE 126 (H) 03/01/2016   CHOL 210 (H) 10/20/2016   TRIG 246.0 (H) 10/20/2016   HDL 55.80 10/20/2016   LDLDIRECT 118.0 10/20/2016   LDLCALC 102 (H) 06/19/2014   ALT 17 03/01/2016   AST 15 03/01/2016    NA 140 03/01/2016   K 3.9 03/01/2016   CL 102 03/01/2016   CREATININE 0.71 03/01/2016   BUN 13 03/01/2016   CO2 29 03/01/2016   TSH 2.65 03/01/2016   INR 1.03 09/12/2011   HGBA1C 5.8 10/29/2016   MICROALBUR 40.1 (H) 03/01/2016   BP Readings from Last 3 Encounters:  10/29/16 138/82  07/02/16 120/70  03/08/16 120/76   Wt Readings from Last 3 Encounters:  10/29/16 144 lb 6.4 oz (65.5 kg)  07/02/16 145 lb (65.8 kg)  03/08/16 143 lb (64.9 kg)     ASSESSMENT AND PLAN:  Discussed the following assessment and plan:  Essential hypertension - controlled but up in am  try taking med in pm  Diabetes mellitus without complication (HCC) - excellent a1c has lost weight to help in the control - Plan: POCT A1C  Hyperlipidemia, unspecified hyperlipidemia type - tg better but advise statin benefor more than risk has never tried a med but has had rx printed livalo to try - Plan: Pitavastatin Calcium 2 MG TABS  Need for prophylactic vaccination and inoculation against influenza - Plan: Flu Vaccine QUAD 36+ mos PF IM (Fluarix & Fluzone Quad PF)  Colon cancer screening - Plan: Ambulatory referral to Gastroenterology  Atypical chest pain - saw cards in past  did not chose to proceed with stress test .  no assoc sx but took moms nitor with help . some pain better with chiro prob cyst inflamed  Dennie Bible feels will get better on own  Just wanted to show me disc other rx optinos -Patient advised to return or notify health care team  if symptoms worsen ,persist or new concerns arise.  Patient Instructions  Try talking bp medication   At night   .   2 CVP get better morning blood pressure readings.  Will  Do a referral for colonoscopy colon screen .  Hg a1c today .   Start the new cholesterol medicine.  If  Not reimbursed by insurance then  Would have you try the atorvastatin  Medication.   If you're having recurrent chest pain that is not felt to be musculoskeletal and when she does see cardiology  again. A stress test or other evaluations can give you assessment of risk for heart disease. I fyou have chest pain with sweating shortness of breath or othera ssociated symptoms seek emergency care. ( ED)   Get your mammogram. Wellness check with hg  a1c and lipids panel in  6 months  Or as needed        Burna MortimerWanda K. Jerian Morais M.D.

## 2016-10-29 NOTE — Patient Instructions (Addendum)
Try talking bp medication   At night   .   2 CVP get better morning blood pressure readings.  Will  Do a referral for colonoscopy colon screen .  Hg a1c today .   Start the new cholesterol medicine.  If  Not reimbursed by insurance then  Would have you try the atorvastatin  Medication.   If you're having recurrent chest pain that is not felt to be musculoskeletal and when she does see cardiology again. A stress test or other evaluations can give you assessment of risk for heart disease. I fyou have chest pain with sweating shortness of breath or othera ssociated symptoms seek emergency care. ( ED)   Get your mammogram. Wellness check with hg a1c and lipids panel in  6 months  Or as needed

## 2016-10-29 NOTE — Progress Notes (Signed)
Pre visit review using our clinic review tool, if applicable. No additional management support is needed unless otherwise documented below in the visit note. 

## 2016-11-19 ENCOUNTER — Telehealth: Payer: Self-pay

## 2016-11-19 NOTE — Telephone Encounter (Signed)
[facility]. PA submitted via form from insurance company. Awaiting approval or denial.

## 2016-12-21 ENCOUNTER — Other Ambulatory Visit: Payer: Self-pay | Admitting: Internal Medicine

## 2016-12-21 DIAGNOSIS — E119 Type 2 diabetes mellitus without complications: Secondary | ICD-10-CM

## 2016-12-23 NOTE — Telephone Encounter (Signed)
Sent to the pharmacy by e-scribe for 6 months.  Pt has upcoming yearly on 05/04/17

## 2017-03-07 ENCOUNTER — Other Ambulatory Visit: Payer: Self-pay | Admitting: Internal Medicine

## 2017-03-07 DIAGNOSIS — Z1231 Encounter for screening mammogram for malignant neoplasm of breast: Secondary | ICD-10-CM

## 2017-03-07 DIAGNOSIS — N644 Mastodynia: Secondary | ICD-10-CM

## 2017-03-18 ENCOUNTER — Ambulatory Visit (INDEPENDENT_AMBULATORY_CARE_PROVIDER_SITE_OTHER): Payer: BC Managed Care – PPO | Admitting: Cardiovascular Disease

## 2017-03-18 ENCOUNTER — Encounter: Payer: Self-pay | Admitting: Cardiovascular Disease

## 2017-03-18 VITALS — BP 108/67 | HR 68 | Ht 62.0 in | Wt 147.6 lb

## 2017-03-18 DIAGNOSIS — E7800 Pure hypercholesterolemia, unspecified: Secondary | ICD-10-CM

## 2017-03-18 DIAGNOSIS — R079 Chest pain, unspecified: Secondary | ICD-10-CM | POA: Diagnosis not present

## 2017-03-18 DIAGNOSIS — I10 Essential (primary) hypertension: Secondary | ICD-10-CM | POA: Diagnosis not present

## 2017-03-18 DIAGNOSIS — R42 Dizziness and giddiness: Secondary | ICD-10-CM

## 2017-03-18 MED ORDER — LISINOPRIL 20 MG PO TABS: 20.0000 mg | ORAL_TABLET | Freq: Every evening | ORAL | 5 refills | Status: DC

## 2017-03-18 MED ORDER — HYDROCHLOROTHIAZIDE 25 MG PO TABS: 25.0000 mg | ORAL_TABLET | ORAL | 5 refills | Status: DC

## 2017-03-18 MED ORDER — PRAVASTATIN SODIUM 40 MG PO TABS: 40.0000 mg | ORAL_TABLET | Freq: Every evening | ORAL | 5 refills | Status: DC

## 2017-03-18 MED ORDER — LISINOPRIL 20 MG PO TABS
20.0000 mg | ORAL_TABLET | Freq: Every evening | ORAL | 5 refills | Status: DC
Start: 2017-03-18 — End: 2018-05-12

## 2017-03-18 NOTE — Patient Instructions (Addendum)
Medication Instructions:  STOP YOUR LISINOPRIL HCT   START LISINOPRIL 20 MG IN THE EVENING  START HYDROCHLOROTHIAZIDE 25 MG IN THE MORNING   START PRAVASTATIN 40 MG IN THE EVENING   Labwork: NONE  Testing/Procedures: Your physician has requested that you have en exercise stress myoview. For further information please visit [email or web address] Please follow instruction sheet, as given.  Your physician has requested that you have a carotid duplex. This test is an ultrasound of the carotid arteries in your neck. It looks at blood flow through these arteries that supply the brain with blood. Allow one hour for this exam. There are no restrictions or special instructions.  Follow-Up: Your physician recommends that you schedule a follow-up appointment in: 6-8 WEEKS   If you need a refill on your cardiac medications before your next appointment, please call your pharmacy.

## 2017-03-18 NOTE — Progress Notes (Signed)
Cardiology Office Note   Date:  03/18/2017   ID:  Amber [name], DOB 27-May-1966, MRN [id number]  PCP:  Lorretta Harp, MD  Cardiologist:   Chilton Si, MD   No chief complaint on file.     History of Present Illness: Amber [name] is a 51 y.o. female with hypertension, hyperlipidemia, and diabetes who is being seen today for the evaluation of chest pain at the request of Panosh, Amber Mends, MD.  Amber [name] reports several months of discomfort in her neck and shoulders. The episodes occur at rest and lasts consistently for 3 days. There is associated shortness of breath, fatigue, and nausea. She notes that if she presses in the center of her chest and causes significant discomfort. The discomfort does not change with exertion. She notes that her symptoms are always worse in the winter.  She also noted some dizziness with the turning of her head left or right. She endorses tinnitus in her right ear. When she has the discomfort in her neck and shoulders she tries using acupuncture or taking Chinese heart pills which contained nitroglycerin and herbs. The pills alleviate the discomfort in her chest, neck, and back.  Amber [name] saw Dr. Rennis Golden 06/2014 for chest pain. She was referred for stress testing but never went for the test. Ms. Dierdre Searles exercises several times per week by swimming and lifting weights. She has no chest pain or discomfort with these activities.  Dr. Fabian Sharp recommended that Amber [name] start atorvastatin.  She did not start this because her endocrinologist told her he would increase her blood glucose.  Dr. Fabian Sharp pitavastatin.  However she has not started taking it due to cost.    Past Medical History:  Diagnosis Date  . Anemia   . Diabetes mellitus   . Hyperlipidemia   . Hypertension     Past Surgical History:  Procedure Laterality Date  . ablastion  09/2011  . blood trans  08/2011  . DILATION AND CURETTAGE OF UTERUS  09/2011  . NO PAST SURGERIES       Current Outpatient Prescriptions    Medication Sig Dispense Refill  . aspirin EC 81 MG tablet Take 81 mg by mouth daily.    . Multiple Vitamin (MULTIVITAMIN) capsule Take 1 capsule by mouth.    . sitaGLIPtin-metformin (JANUMET) 50-1000 MG tablet Take 2 tablets by mouth daily.    . hydrochlorothiazide (HYDRODIURIL) 25 MG tablet Take 1 tablet (25 mg total) by mouth every morning. 30 tablet 5  . lisinopril (PRINIVIL,ZESTRIL) 20 MG tablet Take 1 tablet (20 mg total) by mouth every evening. 30 tablet 5  . pravastatin (PRAVACHOL) 40 MG tablet Take 1 tablet (40 mg total) by mouth every evening. 30 tablet 5   No current facility-administered medications for this visit.     Allergies:   Actos [pioglitazone] and Other    Social History:  The patient  reports that she has never smoked. She has never used smokeless tobacco. She reports that she does not drink alcohol or use drugs.   Family History:  The patient's family history includes CAD in her father and mother; Developmental delay in her daughter; Diabetes in her father, other, and paternal grandmother; Hypertension in her maternal grandmother and mother; Kidney failure in her paternal grandmother; Stroke in her maternal grandmother and other.    ROS:  Please see the history of present illness.   Otherwise, review of systems are positive for none.   All other systems are reviewed and negative.  PHYSICAL EXAM: VS:  BP 108/67 (BP Location: Right Arm)   Pulse 68   Ht  (1.575 m)   Wt 67 kg (147 lb 9.6 oz)   BMI 27.00 kg/m  , BMI Body mass index is 27 kg/m. GENERAL:  Well appearing HEENT:  Pupils equal round and reactive, fundi not visualized, oral mucosa unremarkable NECK:  No jugular venous distention, waveform within normal limits, carotid upstroke brisk and symmetric, no bruits, no thyromegaly LYMPHATICS:  No cervical adenopathy LUNGS:  Clear to auscultation bilaterally HEART:  RRR.  PMI not displaced or sustained,S1 and S2 within normal limits, no S3, no S4, no  clicks, no rubs, no  murmurs ABD:  Flat, positive bowel sounds normal in frequency in pitch, no bruits, no rebound, no guarding, no midline pulsatile mass, no hepatomegaly, no splenomegaly EXT:  2 plus pulses throughout, no edema, no cyanosis no clubbing SKIN:  No rashes no nodules NEURO:  Cranial nerves II through XII grossly intact, motor grossly intact throughout PSYCH:  Cognitively intact, oriented to person place and time    EKG:  EKG is ordered today. The ekg ordered today demonstrates sinus rhythm.  Rate 68 bpm.    Recent Labs: No results found for requested labs within last 8760 hours.    Lipid Panel    Component Value Date/Time   CHOL 210 (H) 10/20/2016 0845   TRIG 246.0 (H) 10/20/2016 0845   HDL 55.80 10/20/2016 0845   CHOLHDL 4 10/20/2016 0845   VLDL 49.2 (H) 10/20/2016 0845   LDLCALC 102 (H) 06/19/2014 0823   LDLDIRECT 118.0 10/20/2016 0845      Wt Readings from Last 3 Encounters:  03/18/17 67 kg (147 lb 9.6 oz)  10/29/16 65.5 kg (144 lb 6.4 oz)  07/02/16 65.8 kg (145 lb)      ASSESSMENT AND PLAN:  # Atypical chest pain: Symptoms are very atypical.  Given that the chest pain is worse with palpation and doesn't occur with exertion, it is unlikely to be ischemia. It is more likely that this is musculoskeletal. However, she is been having the same pain intermittently for years. We will obtain an exercise Myoview to assess.  continue aspirin 81 mg daily.  # Hypertension:  Blood pressure is well-controlled today. She takes her medication in the morning and notes that prior to taking her next dose her blood pressure is high. We will split her HCTZ/lisinopril.  She will take HCTZ in the AM and lisinopril at night.   # Hyperlipidemia:  Start pravastatin 40 mg daily.  She will need lipids and a CMP in 6 weeks.      Current medicines are reviewed at length with the patient today.  The patient does not have concerns regarding medicines.  The following changes have  been made:  Switch pitavastatin to pravastatin.   Labs/ tests ordered today include:   Orders Placed This Encounter  Procedures  . Myocardial Perfusion Imaging  . EKG 12-Lead     Disposition:   FU with Stormy Sabol C. Duke Salvia, MD, [facility] in 6-8 weeks     This note was written with the assistance of speech recognition software.  Please excuse any transcriptional errors.  Signed, Aybree Lanyon C. Duke Salvia, MD, [facility]  03/18/2017 5:36 PM    [facility]

## 2017-04-13 ENCOUNTER — Telehealth (HOSPITAL_COMMUNITY): Payer: Self-pay

## 2017-04-13 NOTE — Telephone Encounter (Signed)
Encounter complete. 

## 2017-04-15 ENCOUNTER — Ambulatory Visit (HOSPITAL_COMMUNITY)
Admission: RE | Admit: 2017-04-15 | Discharge: 2017-04-15 | Disposition: A | Discharge status: Home / Self Care | Payer: BC Managed Care – PPO | Source: Ambulatory Visit | Attending: Cardiovascular Disease | Admitting: Cardiovascular Disease

## 2017-04-15 DIAGNOSIS — E785 Hyperlipidemia, unspecified: Secondary | ICD-10-CM | POA: Diagnosis not present

## 2017-04-15 DIAGNOSIS — R42 Dizziness and giddiness: Secondary | ICD-10-CM | POA: Insufficient documentation

## 2017-04-15 DIAGNOSIS — Z8249 Family history of ischemic heart disease and other diseases of the circulatory system: Secondary | ICD-10-CM | POA: Insufficient documentation

## 2017-04-15 DIAGNOSIS — E119 Type 2 diabetes mellitus without complications: Secondary | ICD-10-CM | POA: Insufficient documentation

## 2017-04-15 DIAGNOSIS — R079 Chest pain, unspecified: Secondary | ICD-10-CM | POA: Diagnosis not present

## 2017-04-15 DIAGNOSIS — I10 Essential (primary) hypertension: Secondary | ICD-10-CM | POA: Insufficient documentation

## 2017-04-15 LAB — MYOCARDIAL PERFUSION IMAGING
CHL CUP NUCLEAR SRS: 0
CHL CUP NUCLEAR SSS: 0
CHL RATE OF PERCEIVED EXERTION: 18
CSEPEDS: 30 s
CSEPEW: 10.1 METS
CSEPPHR: 155 {beats}/min
Exercise duration (min): 9 min
LV dias vol: 78 mL (ref 46–106)
LVSYSVOL: 31 mL
MPHR: 169 {beats}/min
NUC STRESS TID: 1.27
Percent HR: 91 %
Rest HR: 62 {beats}/min
SDS: 0

## 2017-04-15 MED ORDER — TECHNETIUM TC 99M TETROFOSMIN IV KIT
9.9000 | PACK | Freq: Once | INTRAVENOUS | Status: AC | PRN
  Administered 2017-04-15: 9.9 via INTRAVENOUS
  Filled 2017-04-15: qty 10

## 2017-04-15 MED ORDER — TECHNETIUM TC 99M TETROFOSMIN IV KIT
32.1000 | PACK | Freq: Once | INTRAVENOUS | Status: AC | PRN
  Administered 2017-04-15: 32.1 via INTRAVENOUS
  Filled 2017-04-15: qty 33

## 2017-04-27 ENCOUNTER — Other Ambulatory Visit (INDEPENDENT_AMBULATORY_CARE_PROVIDER_SITE_OTHER): Payer: BC Managed Care – PPO

## 2017-04-27 DIAGNOSIS — E119 Type 2 diabetes mellitus without complications: Secondary | ICD-10-CM

## 2017-04-27 DIAGNOSIS — I10 Essential (primary) hypertension: Secondary | ICD-10-CM | POA: Diagnosis not present

## 2017-04-27 DIAGNOSIS — E785 Hyperlipidemia, unspecified: Secondary | ICD-10-CM | POA: Diagnosis not present

## 2017-04-27 DIAGNOSIS — Z0000 Encounter for general adult medical examination without abnormal findings: Secondary | ICD-10-CM

## 2017-04-27 LAB — LIPID PANEL
CHOLESTEROL: 189 mg/dL (ref 0–200)
HDL: 61.7 mg/dL (ref >39.00)
LDL Cholesterol: 94 mg/dL (ref 0–99)
NONHDL: 127.16
TRIGLYCERIDES: 165 mg/dL — AB (ref 0.0–149.0)
Total CHOL/HDL Ratio: 3
VLDL: 33 mg/dL (ref 0.0–40.0)

## 2017-04-27 LAB — CBC WITH DIFFERENTIAL/PLATELET
Basophils Absolute: 0.1 10*3/uL (ref 0.0–0.1)
Basophils Relative: 0.7 % (ref 0.0–3.0)
EOS PCT: 1.4 % (ref 0.0–5.0)
Eosinophils Absolute: 0.1 10*3/uL (ref 0.0–0.7)
HCT: 39.4 % (ref 36.0–46.0)
Hemoglobin: 13.2 g/dL (ref 12.0–15.0)
Lymphocytes Relative: 34 % (ref 12.0–46.0)
Lymphs Abs: 2.5 10*3/uL (ref 0.7–4.0)
MCHC: 33.5 g/dL (ref 30.0–36.0)
MCV: 88.5 fl (ref 78.0–100.0)
MONO ABS: 0.4 10*3/uL (ref 0.1–1.0)
MONOS PCT: 5.5 % (ref 3.0–12.0)
NEUTROS ABS: 4.3 10*3/uL (ref 1.4–7.7)
NEUTROS PCT: 58.4 % (ref 43.0–77.0)
PLATELETS: 217 10*3/uL (ref 150.0–400.0)
RBC: 4.46 Mil/uL (ref 3.87–5.11)
RDW: 14 % (ref 11.5–15.5)
WBC: 7.4 10*3/uL (ref 4.0–10.5)

## 2017-04-27 LAB — HEPATIC FUNCTION PANEL
ALBUMIN: 4.2 g/dL (ref 3.5–5.2)
ALT: 12 U/L (ref 0–35)
AST: 11 U/L (ref 0–37)
Alkaline Phosphatase: 53 U/L (ref 39–117)
BILIRUBIN TOTAL: 0.5 mg/dL (ref 0.2–1.2)
Bilirubin, Direct: 0.1 mg/dL (ref 0.0–0.3)
Total Protein: 6.7 g/dL (ref 6.0–8.3)

## 2017-04-27 LAB — BASIC METABOLIC PANEL
BUN: 11 mg/dL (ref 6–23)
CO2: 29 meq/L (ref 19–32)
Calcium: 9.2 mg/dL (ref 8.4–10.5)
Chloride: 102 mEq/L (ref 96–112)
Creatinine, Ser: 0.65 mg/dL (ref 0.40–1.20)
GFR: 102.05 mL/min (ref >60.00)
GLUCOSE: 134 mg/dL — AB (ref 70–99)
POTASSIUM: 3.6 meq/L (ref 3.5–5.1)
SODIUM: 139 meq/L (ref 135–145)

## 2017-04-27 LAB — HEMOGLOBIN A1C: HEMOGLOBIN A1C: 6.4 % (ref 4.6–6.5)

## 2017-04-27 LAB — MICROALBUMIN / CREATININE URINE RATIO
Creatinine,U: 120.1 mg/dL
MICROALB/CREAT RATIO: 29.4 mg/g (ref 0.0–30.0)
Microalb, Ur: 35.3 mg/dL — ABNORMAL HIGH (ref 0.0–1.9)

## 2017-04-27 LAB — TSH: TSH: 3.41 u[IU]/mL (ref 0.35–4.50)

## 2017-04-29 ENCOUNTER — Ambulatory Visit: Payer: BC Managed Care – PPO | Admitting: Cardiovascular Disease

## 2017-05-03 NOTE — Progress Notes (Signed)
Chief Complaint  Patient presents with  . Annual Exam    HPI: Patient  Amber [name]  51 y.o. comes in today for Preventive Health Care visit  And Chronic disease management   DM  No change  eatign more sweelts  BP no se of meds   LIPIDS no se   concernabout fam hx renal failure urine sometimes looks  Cloudy?  Had stress test   For atypical cp and neg   And clear.    acupunture massage and pt helps ant cp and other body pains  Says has ibs for years  No  Blood fever    Last pap 2017 neg neg hrhpv  Health Maintenance  Topic Date Due  . PNEUMOCOCCAL POLYSACCHARIDE VACCINE (1) 02/08/1968  . OPHTHALMOLOGY EXAM  02/08/1976  . MAMMOGRAM  02/08/2016  . COLONOSCOPY  02/08/2016  . FOOT EXAM  03/08/2017  . HIV Screening  05/04/2018 (Originally 02/[birth year]/1981)  . INFLUENZA VACCINE  07/20/2017  . HEMOGLOBIN A1C  10/28/2017  . PAP SMEAR  03/09/2019  . TETANUS/TDAP  06/05/2023   Health Maintenance Review LIFESTYLE:  Exercise:   Weights walking  Tobacco/ETS:n Alcohol: n Sugar beverages:n Sleep:7 Drug use: no HH of 3 no pets   ROS:  GEN/ HEENT: No fever, significant weight changes sweats headaches vision problems hearing changes, CV/ PULM; No chest pain shortness of breath cough, syncope,edema  change in exercise tolerance. GI /GU: No adominal pain, vomiting, change in bowel habits.  Had IBs sx she says  loos and diarrhea like at times  No blood in the stool. No significant GU symptoms. SKIN/HEME: ,no acute skin rashes suspicious lesions or bleeding. No lymphadenopathy, nodules, masses.  NEURO/ PSYCH:  No neurologic signs such as weakness numbness. No depression anxiety. IMM/ Allergy: No unusual infections.  Allergy .   REST of 12 system review negative except as per HPI   Past Medical History:  Diagnosis Date  . Anemia   . Diabetes mellitus   . Hyperlipidemia   . Hypertension     Past Surgical History:  Procedure Laterality Date  . ablastion  09/2011  . blood trans   08/2011  . DILATION AND CURETTAGE OF UTERUS  09/2011  . NO PAST SURGERIES      Family History  Problem Relation Age of Onset  . Hypertension Mother   . CAD Mother   . Diabetes Father   . CAD Father   . Diabetes Other        Grandparent  . Stroke Other        Grandparent  . Hyperlipidemia Unknown        parents  . Developmental delay Daughter   . Stroke Maternal Grandmother   . Hypertension Maternal Grandmother   . Diabetes Paternal Grandmother   . Kidney failure Paternal Grandmother     Social History   Social History  . Marital status: Married    Spouse name: N/A  . Number of children: 1  . Years of education: BS   Occupational History  . Acupuncturist    Social History Main Topics  . Smoking status: Never Smoker  . Smokeless tobacco: Never Used  . Alcohol use No  . Drug use: No  . Sexual activity: Yes    Birth control/ protection: Surgical   Other Topics Concern  . 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    G1P1 child has developmental  disability  Poss genetically based eval at [facility]   hh of 3  34 hours per week    Neg etsFA   [place] last eye exam .     Outpatient Medications Prior to Visit  Medication Sig Dispense Refill  . aspirin EC 81 MG tablet Take 81 mg by mouth daily.    . hydrochlorothiazide (HYDRODIURIL) 25 MG tablet Take 1 tablet (25 mg total) by mouth every morning. 30 tablet 5  . lisinopril (PRINIVIL,ZESTRIL) 20 MG tablet Take 1 tablet (20 mg total) by mouth every evening. 30 tablet 5  . Multiple Vitamin (MULTIVITAMIN) capsule Take 1 capsule by mouth.    . pravastatin (PRAVACHOL) 40 MG tablet Take 1 tablet (40 mg total) by mouth every evening. 30 tablet 5  . sitaGLIPtin-metformin (JANUMET) 50-1000 MG tablet Take 2 tablets by mouth daily.     No facility-administered medications prior to visit.      EXAM:  BP 104/70 (BP Location: Right Arm, Patient Position: Sitting, Cuff Size:  Normal)   Pulse 65   Temp 97.4 F (36.3 C) (Oral)   Ht 5\' 3"  (1.6 m)   Wt 146 lb 3.2 oz (66.3 kg)   BMI 25.90 kg/m   Body mass index is 25.9 kg/m. Wt Readings from Last 3 Encounters:  05/04/17 146 lb 3.2 oz (66.3 kg)  04/15/17 147 lb (66.7 kg)  03/18/17 147 lb 9.6 oz (67 kg)    Physical Exam: Vital signs reviewed [id number] is a well-developed well-nourished alert cooperative    who appearsr stated age in no acute distress.  HEENT: normocephalic atraumatic , Eyes: PERRL EOM's full, conjunctiva clear, Nares: paten,t no deformity discharge or tenderness., Ears: no deformity EAC's clear TMs with normal landmarks. Mouth: clear OP, no lesions, edema.  Moist mucous membranes. Dentition in adequate repair. NECK: supple without masses, thyromegaly or bruits. CHEST/PULM:  Clear to auscultation and percussion breath sounds equal no wheeze , rales or rhonchi. No chest wall deformities or tenderness. Breast: normal by inspection . No dimpling, discharge, masses, tenderness or discharge . CV: PMI is nondisplaced, S1 S2 no gallops, murmurs, rubs. Peripheral pulses are full without delay.No JVD .  ABDOMEN: Bowel sounds normal nontender  No guard or rebound, no hepato splenomegal no CVA tenderness.  No hernia. Extremtities:  No clubbing cyanosis or edema, no acute joint swelling or redness no focal atrophy NEURO:  Oriented x3, cranial nerves 3-12 appear to be intact, no obvious focal weakness,gait within normal limits no abnormal reflexes or asymmetrical SKIN: No acute rashes normal turgor, color, no bruising or petechiae. PSYCH: Oriented, good eye contact, no obvious depression anxiety, cognition and judgment appear normal. LN: no cervical axillary inguinal adenopathy Diabetic Foot Exam - Simple   Simple Foot Form Diabetic Foot exam was performed with the following findings:  Yes 05/04/2017 10:30 AM  Visual Inspection No deformities, no ulcerations, no other skin breakdown bilaterally:   Yes Sensation Testing Intact to touch and monofilament testing bilaterally:  Yes Pulse Check Posterior Tibialis and Dorsalis pulse intact bilaterally:  Yes Comments     Foot exam  Lab Results  Component Value Date   WBC 7.4 04/27/2017   HGB 13.2 04/27/2017   HCT 39.4 04/27/2017   PLT 217.0 04/27/2017   GLUCOSE 134 (H) 04/27/2017   CHOL 189 04/27/2017   TRIG 165.0 (H) 04/27/2017   HDL 61.70 04/27/2017   LDLDIRECT 118.0 10/20/2016   LDLCALC 94 04/27/2017   ALT 12 04/27/2017   AST 11 04/27/2017  NA 139 04/27/2017   K 3.6 04/27/2017   CL 102 04/27/2017   CREATININE 0.65 04/27/2017   BUN 11 04/27/2017   CO2 29 04/27/2017   TSH 3.41 04/27/2017   INR 1.03 09/12/2011   HGBA1C 6.4 04/27/2017   MICROALBUR 35.3 (H) 04/27/2017    BP Readings from Last 3 Encounters:  05/04/17 104/70  03/18/17 108/67  10/29/16 138/82    Lab results reviewed with patient   ASSESSMENT AND PLAN:  Discussed the following assessment and plan:  Visit for preventive health examination - Reviewed parameters  Medication management  Diabetes mellitus without complication (HCC) - controlled  Essential hypertension  Hyperlipidemia, unspecified hyperlipidemia type  Colon cancer screening - Plan: Ambulatory referral to Gastroenterology reviewed screening  test  Refer for colon   She had varicella vaccine  And thus not candidate for ? shingrix  But will check  Pt prefer to hold on pneumovax at this time  Patient Care Team: Madelin Headings, MD as PCP - General (Internal Medicine) Patient Instructions  Intensify lifestyle interventions. Blood sugar is up from last time.   Get a mammogram . Will be contacted about  colonoscopy screening .   ROV in 6 months and we can do A1c at visit  Your kidney function is normal.    Burna Mortimer K. Romaine Maciolek M.D.

## 2017-05-04 ENCOUNTER — Encounter: Payer: Self-pay | Admitting: Internal Medicine

## 2017-05-04 ENCOUNTER — Ambulatory Visit (INDEPENDENT_AMBULATORY_CARE_PROVIDER_SITE_OTHER): Payer: BC Managed Care – PPO | Admitting: Internal Medicine

## 2017-05-04 VITALS — BP 104/70 | HR 65 | Temp 97.4°F | Ht 63.0 in | Wt 146.2 lb

## 2017-05-04 DIAGNOSIS — Z0000 Encounter for general adult medical examination without abnormal findings: Secondary | ICD-10-CM

## 2017-05-04 DIAGNOSIS — E119 Type 2 diabetes mellitus without complications: Secondary | ICD-10-CM | POA: Diagnosis not present

## 2017-05-04 DIAGNOSIS — I10 Essential (primary) hypertension: Secondary | ICD-10-CM | POA: Diagnosis not present

## 2017-05-04 DIAGNOSIS — Z1211 Encounter for screening for malignant neoplasm of colon: Secondary | ICD-10-CM

## 2017-05-04 DIAGNOSIS — E785 Hyperlipidemia, unspecified: Secondary | ICD-10-CM | POA: Diagnosis not present

## 2017-05-04 DIAGNOSIS — Z79899 Other long term (current) drug therapy: Secondary | ICD-10-CM | POA: Diagnosis not present

## 2017-05-04 NOTE — Patient Instructions (Signed)
Intensify lifestyle interventions. Blood sugar is up from last time.   Get a mammogram . Will be contacted about  colonoscopy screening .   ROV in 6 months and we can do A1c at visit  Your kidney function is normal.

## 2017-07-02 ENCOUNTER — Other Ambulatory Visit: Payer: Self-pay | Admitting: Internal Medicine

## 2017-07-06 ENCOUNTER — Encounter: Payer: Self-pay | Admitting: Internal Medicine

## 2017-07-16 ENCOUNTER — Other Ambulatory Visit: Payer: Self-pay | Admitting: Internal Medicine

## 2017-09-09 ENCOUNTER — Encounter: Payer: Self-pay | Admitting: Internal Medicine

## 2017-10-01 ENCOUNTER — Other Ambulatory Visit: Payer: Self-pay | Admitting: Internal Medicine

## 2017-10-18 ENCOUNTER — Ambulatory Visit: Payer: BC Managed Care – PPO | Admitting: Internal Medicine

## 2017-12-03 ENCOUNTER — Other Ambulatory Visit: Payer: Self-pay | Admitting: Internal Medicine

## 2018-01-17 ENCOUNTER — Encounter: Payer: Self-pay | Admitting: Internal Medicine

## 2018-01-30 ENCOUNTER — Other Ambulatory Visit: Payer: Self-pay | Admitting: Internal Medicine

## 2018-01-30 ENCOUNTER — Other Ambulatory Visit: Payer: Self-pay | Admitting: Gastroenterology

## 2018-01-30 DIAGNOSIS — R109 Unspecified abdominal pain: Secondary | ICD-10-CM

## 2018-02-13 ENCOUNTER — Other Ambulatory Visit: Payer: Self-pay | Admitting: Internal Medicine

## 2018-02-13 ENCOUNTER — Ambulatory Visit
Admission: RE | Admit: 2018-02-13 | Discharge: 2018-02-13 | Disposition: A | Discharge status: Home / Self Care | Payer: BC Managed Care – PPO | Source: Ambulatory Visit | Attending: Gastroenterology | Admitting: Gastroenterology

## 2018-02-13 DIAGNOSIS — Z1231 Encounter for screening mammogram for malignant neoplasm of breast: Secondary | ICD-10-CM

## 2018-02-13 DIAGNOSIS — R109 Unspecified abdominal pain: Secondary | ICD-10-CM

## 2018-03-07 ENCOUNTER — Ambulatory Visit
Admission: RE | Admit: 2018-03-07 | Discharge: 2018-03-07 | Disposition: A | Discharge status: Home / Self Care | Payer: BC Managed Care – PPO | Source: Ambulatory Visit | Attending: Internal Medicine | Admitting: Internal Medicine

## 2018-03-07 DIAGNOSIS — Z1231 Encounter for screening mammogram for malignant neoplasm of breast: Secondary | ICD-10-CM

## 2018-03-14 ENCOUNTER — Other Ambulatory Visit: Payer: Self-pay | Admitting: Internal Medicine

## 2018-03-27 ENCOUNTER — Ambulatory Visit: Payer: BC Managed Care – PPO | Admitting: Internal Medicine

## 2018-04-06 ENCOUNTER — Other Ambulatory Visit: Payer: Self-pay | Admitting: Internal Medicine

## 2018-04-10 NOTE — Telephone Encounter (Signed)
Medication filled to pharmacy as requested.  Patient has CPE scheduled in May 2019.

## 2018-05-10 NOTE — Progress Notes (Signed)
Chief Complaint  Patient presents with  . Annual Exam    Frequent urination and incontinence x 2 months. Blood sugars have been ranging in the 110's-130's fasting / Pt c/o left neck pain which radiates into her chest causing some pressure. Happens seasonally, pt feels related to possible neck issue./ Requests rectal exam today d/t pain she has been having.    HPI: Patient  Amber [name]  52 y.o. comes in today for Preventive Health Care visit   And Chronic disease management .   BG 110 - 130    Had eye exam in [place] .   No neuro sx   Neck pain  Or spoft tissue.   Acupuncture and  Pt.  ocass down to chest    Better with  These measures and  Thinks job causes it with leaning over posture  Last strs  Urinary  Sx worse  At times   frequency recently  No fever  Has had cox rectal pain for years   GI advise colon and endo at same time?  To check for some type of problem common in   [place]? h pylori)   Got myalgias with pravastatin  Needs refills  bp and other med   Health Maintenance  Topic Date Due  . OPHTHALMOLOGY EXAM  02/08/1976  . HIV Screening  02/07/1981  . COLONOSCOPY  02/08/2016  . HEMOGLOBIN A1C  10/28/2017  . FOOT EXAM  05/04/2018  . PNEUMOCOCCAL POLYSACCHARIDE VACCINE (1) 05/13/2019 (Originally 02/[birth year]/1968)  . INFLUENZA VACCINE  07/20/2018  . PAP SMEAR  03/09/2019  . MAMMOGRAM  03/07/2020  . TETANUS/TDAP  06/05/2023   Health Maintenance Review LIFESTYLE:  Exercise:  Good  Tobacco/ETS: no Alcohol:  No etoh  Sugar beverages:OJ honey    Little sugar   Sleep:  har dot fall asleep .  With neck pain  8 hours  Drug use: no HH of 3   No pets  Work:[employer]ve 1 per day .     ROS:   Ringing in ears  24 hours   See above  GEN/ HEENT: No fever, significant weight changes sweats headaches vision problems hearing changes, CV/ PULM; No chest pain shortness of breath cough, syncope,edema  change in exercise tolerance. GI /GU: No adominal pain, vomiting, change in bowel  habits. No blood in the stool. No significant GU symptoms. SKIN/HEME: ,no acute skin rashes suspicious lesions or bleeding. No lymphadenopathy, nodules, masses.  NEURO/ PSYCH:  No neurologic signs such as weakness numbness. No depression anxiety. IMM/ Allergy: No unusual infections.  Allergy .   REST of 12 system review negative except as per HPI   Past Medical History:  Diagnosis Date  . Anemia   . Diabetes mellitus   . Hyperlipidemia   . Hypertension     Past Surgical History:  Procedure Laterality Date  . ablastion  09/2011  . blood trans  08/2011  . DILATION AND CURETTAGE OF UTERUS  09/2011  . NO PAST SURGERIES      Family History  Problem Relation Age of Onset  . Hypertension Mother   . CAD Mother   . Diabetes Father   . CAD Father   . Diabetes Other        Grandparent  . Stroke Other        Grandparent  . Hyperlipidemia Unknown        parents  . Developmental delay Daughter   . Stroke Maternal Grandmother   . Hypertension  Maternal Grandmother   . Diabetes Paternal Grandmother   . Kidney failure Paternal Grandmother     Social History   Socioeconomic History  . Marital status: Married    Spouse name: Not on file  . Number of children: 1  . Years of education: BS  . Highest education level: Not on file  Occupational History  . Occupation: Acupuncturist  Social Needs  . Financial resource strain: Not on file  . Food insecurity:    Worry: Not on file    Inability: Not on file  . Transportation needs:    Medical: Not on file    Non-medical: Not on file  Tobacco Use  . Smoking status: Never Smoker  . Smokeless tobacco: Never Used  Substance and Sexual Activity  . Alcohol use: No  . Drug use: No  . Sexual activity: Yes    Birth control/protection: Surgical  Lifestyle  . Physical activity:    Days per week: Not on file    Minutes per session: Not on file  . Stress: Not on file  Relationships  . Social connections:    Talks on phone: Not on file     Gets together: Not on file    Attends religious service: Not on file    Active member of club or organization: Not on file    Attends meetings of clubs or organizations: Not on file    Relationship status: Not on file  Other Topics Concern  . 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    G1P1 child has developmental disability  Poss genetically based eval at [facility]   hh of 3  34 hours per week    Neg etsFA   [place] last eye exam .     Outpatient Medications Prior to Visit  Medication Sig Dispense Refill  . aspirin EC 81 MG tablet Take 81 mg by mouth daily.    . Multiple Vitamin (MULTIVITAMIN) capsule Take 1 capsule by mouth.    Marland Kitchen lisinopril-hydrochlorothiazide (PRINZIDE,ZESTORETIC) 20-25 MG tablet Take 1 tablet by mouth once daily 90 tablet 0  . sitaGLIPtin-metformin (JANUMET) 50-1000 MG tablet Take 2 tablets by mouth daily.    . pravastatin (PRAVACHOL) 40 MG tablet Take 1 tablet (40 mg total) by mouth every evening. 30 tablet 5  . hydrochlorothiazide (HYDRODIURIL) 25 MG tablet Take 1 tablet (25 mg total) by mouth every morning. 30 tablet 5  . JANUMET XR 50-1000 MG TB24 TAKE 2 TABLETS BY MOUTH ONCE A DAY **NEEDS OFFICE VISIT AND A1C CHECK** (Patient not taking: Reported on 05/12/2018) 60 tablet 1  . lisinopril (PRINIVIL,ZESTRIL) 20 MG tablet Take 1 tablet (20 mg total) by mouth every evening. 30 tablet 5   No facility-administered medications prior to visit.      EXAM:  BP 118/82 (BP Location: Right Arm, Patient Position: Sitting, Cuff Size: Normal)   Pulse 65   Temp 98.2 F (36.8 C) (Oral)   Ht 5' 2.75" (1.594 m)   Wt 144 lb 9.6 oz (65.6 kg)   BMI 25.82 kg/m   Body mass index is 25.82 kg/m. Wt Readings from Last 3 Encounters:  05/12/18 144 lb 9.6 oz (65.6 kg)  05/04/17 146 lb 3.2 oz (66.3 kg)  04/15/17 147 lb (66.7 kg)    Physical Exam: Vital signs reviewed QHU:TMLY is a well-developed well-nourished  alert cooperative    who appearsr stated age in no acute  distress.  HEENT: normocephalic atraumatic , Eyes: PERRL EOM's full, conjunctiva clear, Nares: paten,t no deformity discharge or tenderness., Ears: no deformity EAC's clear TMs with normal landmarks. Mouth: clear OP, no lesions, edema.  Moist mucous membranes. Dentition in adequate repair. NECK: supple without masses, thyromegaly or bruits. CHEST/PULM:  Clear to auscultation and percussion breath sounds equal no wheeze , rales or rhonchi. No chest wall deformities or tenderness. Breast: normal by inspection . No dimpling, discharge, masses, tenderness or discharge . CV: PMI is nondisplaced, S1 S2 no gallops, murmurs, rubs. Peripheral pulses are full without delay.No JVD .  ABDOMEN: Bowel sounds normal nontender  No guard or rebound, no hepato splenomegal no CVA tenderness.  No hernia. Extremtities:  No clubbing cyanosis or edema, no acute joint swelling or redness no focal atrophy NEURO:  Oriented x3, cranial nerves 3-12 appear to be intact, no obvious focal weakness,gait within normal limits no abnormal reflexes or asymmetrical SKIN: No acute rashes normal turgor, color, no bruising or petechiae. PSYCH: Oriented, good eye contact, no obvious depression anxiety, cognition and judgment appear normal. LN: no cervical axillary inguinal adenopathy  Lab Results  Component Value Date   WBC 5.8 05/12/2018   HGB 13.9 05/12/2018   HCT 40.9 05/12/2018   PLT 259.0 05/12/2018   GLUCOSE 111 (H) 05/12/2018   CHOL 230 (H) 05/12/2018   TRIG 188.0 (H) 05/12/2018   HDL 57.30 05/12/2018   LDLDIRECT 118.0 10/20/2016   LDLCALC 135 (H) 05/12/2018   ALT 13 05/12/2018   AST 13 05/12/2018   NA 140 05/12/2018   K 3.9 05/12/2018   CL 99 05/12/2018   CREATININE 0.73 05/12/2018   BUN 14 05/12/2018   CO2 32 05/12/2018   TSH 1.83 05/12/2018   INR 1.03 09/12/2011   HGBA1C 6.0 05/12/2018   MICROALBUR 26.3 (H) 05/12/2018    BP Readings from Last 3  Encounters:  05/12/18 118/82  05/04/17 104/70  03/18/17 108/67      ASSESSMENT AND PLAN:  Discussed the following assessment and plan:  Visit for preventive health examination - Plan: POCT Urinalysis Dipstick (Automated), Microalbumin / creatinine urine ratio, Basic metabolic panel, CBC with Differential/Platelet, Hemoglobin A1c, Hepatic function panel, Lipid panel, TSH  Medication management - Plan: POCT Urinalysis Dipstick (Automated), Microalbumin / creatinine urine ratio, Basic metabolic panel, CBC with Differential/Platelet, Hemoglobin A1c, Hepatic function panel, Lipid panel, TSH  Diabetes mellitus without complication ([place]) - Plan: POCT Urinalysis Dipstick (Automated), Microalbumin / creatinine urine ratio, Basic metabolic panel, CBC with Differential/Platelet, Hemoglobin A1c, Hepatic function panel, Lipid panel, TSH  Essential hypertension - Plan: POCT Urinalysis Dipstick (Automated), Microalbumin / creatinine urine ratio, Basic metabolic panel, CBC with Differential/Platelet, Hemoglobin A1c, Hepatic function panel, Lipid panel, TSH  Hyperlipidemia, unspecified hyperlipidemia type - Plan: POCT Urinalysis Dipstick (Automated), Microalbumin / creatinine urine ratio, Basic metabolic panel, CBC with Differential/Platelet, Hemoglobin A1c, Hepatic function panel, Lipid panel, TSH  Urinary symptom or sign - Plan: POCT Urinalysis Dipstick (Automated), Microalbumin / creatinine urine ratio, Basic metabolic panel, CBC with Differential/Platelet, Hemoglobin A1c, Hepatic function panel, Lipid panel, TSH Reports  prob atorva  Elevated bg and  Myalgias with  Pravastatin  Will await result to decide   Advise get colon exam  abd Talk with gi  Has had rectal pain that is positional for years   ? If coccygeal ?  No recent injury needs     dsic vaccine wants to wait on  Pneumovax   Disc shingrix  Patient Care Team: Burnis Medin,  MD as PCP - General (Internal Medicine) Patient Instructions  Get  your colonoscopy.     Will notify you  of labs when available.   I agree that your neck  Issues is mechanical and  Look at postural  Management .   Usually get  Pneumococcal vaccine with DM  .      Health Maintenance, Female Adopting a healthy lifestyle and getting preventive care can go a long way to promote health and wellness. Talk with your health care provider about what schedule of regular examinations is right for you. This is a good chance for you to check in with your provider about disease prevention and staying healthy. In between checkups, there are plenty of things you can do on your own. Experts have done a lot of research about which lifestyle changes and preventive measures are most likely to keep you healthy. Ask your health care provider for more information. Weight and diet Eat a healthy diet  Be sure to include plenty of vegetables, fruits, low-fat dairy products, and lean protein.  Do not eat a lot of foods high in solid fats, added sugars, or salt.  Get regular exercise. This is one of the most important things you can do for your health. ? Most adults should exercise for at least 150 minutes each week. The exercise should increase your heart rate and make you sweat (moderate-intensity exercise). ? Most adults should also do strengthening exercises at least twice a week. This is in addition to the moderate-intensity exercise.  Maintain a healthy weight  Body mass index (BMI) is a measurement that can be used to identify possible weight problems. It estimates body fat based on height and weight. Your health care provider can help determine your BMI and help you achieve or maintain a healthy weight.  For females 53 years of age and older: ? A BMI below 18.5 is considered underweight. ? A BMI of 18.5 to 24.9 is normal. ? A BMI of 25 to 29.9 is considered overweight. ? A BMI of 30 and above is considered obese.  Watch levels of cholesterol and blood lipids  You  should start having your blood tested for lipids and cholesterol at 52 years of age, then have this test every 5 years.  You may need to have your cholesterol levels checked more often if: ? Your lipid or cholesterol levels are high. ? You are older than 52 years of age. ? You are at high risk for heart disease.  Cancer screening Lung Cancer  Lung cancer screening is recommended for adults 13-68 years old who are at high risk for lung cancer because of a history of smoking.  A yearly low-dose CT scan of the lungs is recommended for people who: ? Currently smoke. ? Have quit within the past 15 years. ? Have at least a 30-pack-year history of smoking. A pack year is smoking an average of one pack of cigarettes a day for 1 year.  Yearly screening should continue until it has been 15 years since you quit.  Yearly screening should stop if you develop a health problem that would prevent you from having lung cancer treatment.  Breast Cancer  Practice breast self-awareness. This means understanding how your breasts normally appear and feel.  It also means doing regular breast self-exams. Let your health care provider know about any changes, no matter how small.  If you are in your 20s or 30s, you should have a clinical breast exam (CBE) by  a health care provider every 1-3 years as part of a regular health exam.  If you are 78 or older, have a CBE every year. Also consider having a breast X-ray (mammogram) every year.  If you have a family history of breast cancer, talk to your health care provider about genetic screening.  If you are at high risk for breast cancer, talk to your health care provider about having an MRI and a mammogram every year.  Breast cancer gene (BRCA) assessment is recommended for women who have family members with BRCA-related cancers. BRCA-related cancers include: ? Breast. ? Ovarian. ? Tubal. ? Peritoneal cancers.  Results of the assessment will determine the  need for genetic counseling and BRCA1 and BRCA2 testing.  Cervical Cancer Your health care provider may recommend that you be screened regularly for cancer of the pelvic organs (ovaries, uterus, and vagina). This screening involves a pelvic examination, including checking for microscopic changes to the surface of your cervix (Pap test). You may be encouraged to have this screening done every 3 years, beginning at age 28.  For women ages 6-65, health care providers may recommend pelvic exams and Pap testing every 3 years, or they may recommend the Pap and pelvic exam, combined with testing for human papilloma virus (HPV), every 5 years. Some types of HPV increase your risk of cervical cancer. Testing for HPV may also be done on women of any age with unclear Pap test results.  Other health care providers may not recommend any screening for nonpregnant women who are considered low risk for pelvic cancer and who do not have symptoms. Ask your health care provider if a screening pelvic exam is right for you.  If you have had past treatment for cervical cancer or a condition that could lead to cancer, you need Pap tests and screening for cancer for at least 20 years after your treatment. If Pap tests have been discontinued, your risk factors (such as having a new sexual partner) need to be reassessed to determine if screening should resume. Some women have medical problems that increase the chance of getting cervical cancer. In these cases, your health care provider may recommend more frequent screening and Pap tests.  Colorectal Cancer  This type of cancer can be detected and often prevented.  Routine colorectal cancer screening usually begins at 52 years of age and continues through 52 years of age.  Your health care provider may recommend screening at an earlier age if you have risk factors for colon cancer.  Your health care provider may also recommend using home test kits to check for hidden blood  in the stool.  A small camera at the end of a tube can be used to examine your colon directly (sigmoidoscopy or colonoscopy). This is done to check for the earliest forms of colorectal cancer.  Routine screening usually begins at age 50.  Direct examination of the colon should be repeated every 5-10 years through 52 years of age. However, you may need to be screened more often if early forms of precancerous polyps or small growths are found.  Skin Cancer  Check your skin from head to toe regularly.  Tell your health care provider about any new moles or changes in moles, especially if there is a change in a mole's shape or color.  Also tell your health care provider if you have a mole that is larger than the size of a pencil eraser.  Always use sunscreen. Apply sunscreen liberally and repeatedly  throughout the day.  Protect yourself by wearing long sleeves, pants, a wide-brimmed hat, and sunglasses whenever you are outside.  Heart disease, diabetes, and high blood pressure  High blood pressure causes heart disease and increases the risk of stroke. High blood pressure is more likely to develop in: ? People who have blood pressure in the high end of the normal range (130-139/85-89 mm Hg). ? People who are overweight or obese. ? People who are African American.  If you are 43-73 years of age, have your blood pressure checked every 3-5 years. If you are 69 years of age or older, have your blood pressure checked every year. You should have your blood pressure measured twice-once when you are at a hospital or clinic, and once when you are not at a hospital or clinic. Record the average of the two measurements. To check your blood pressure when you are not at a hospital or clinic, you can use: ? An automated blood pressure machine at a pharmacy. ? A home blood pressure monitor.  If you are between 62 years and 52 years old, ask your health care provider if you should take aspirin to prevent  strokes.  Have regular diabetes screenings. This involves taking a blood sample to check your fasting blood sugar level. ? If you are at a normal weight and have a low risk for diabetes, have this test once every three years after 52 years of age. ? If you are overweight and have a high risk for diabetes, consider being tested at a younger age or more often. Preventing infection Hepatitis B  If you have a higher risk for hepatitis B, you should be screened for this virus. You are considered at high risk for hepatitis B if: ? You were born in a country where hepatitis B is common. Ask your health care provider which countries are considered high risk. ? Your parents were born in a high-risk country, and you have not been immunized against hepatitis B (hepatitis B vaccine). ? You have HIV or AIDS. ? You use needles to inject street drugs. ? You live with someone who has hepatitis B. ? You have had sex with someone who has hepatitis B. ? You get hemodialysis treatment. ? You take certain medicines for conditions, including cancer, organ transplantation, and autoimmune conditions.  Hepatitis C  Blood testing is recommended for: ? Everyone born from [birth year] through 1965. ? Anyone with known risk factors for hepatitis C.  Sexually transmitted infections (STIs)  You should be screened for sexually transmitted infections (STIs) including gonorrhea and chlamydia if: ? You are sexually active and are younger than 52 years of age. ? You are older than 52 years of age and your health care provider tells you that you are at risk for this type of infection. ? Your sexual activity has changed since you were last screened and you are at an increased risk for chlamydia or gonorrhea. Ask your health care provider if you are at risk.  If you do not have HIV, but are at risk, it may be recommended that you take a prescription medicine daily to prevent HIV infection. This is called pre-exposure prophylaxis  (PrEP). You are considered at risk if: ? You are sexually active and do not regularly use condoms or know the HIV status of your partner(s). ? You take drugs by injection. ? You are sexually active with a partner who has HIV.  Talk with your health care provider about whether you are  at high risk of being infected with HIV. If you choose to begin PrEP, you should first be tested for HIV. You should then be tested every 3 months for as long as you are taking PrEP. Pregnancy  If you are premenopausal and you may become pregnant, ask your health care provider about preconception counseling.  If you may become pregnant, take 400 to 800 micrograms (mcg) of folic acid every day.  If you want to prevent pregnancy, talk to your health care provider about birth control (contraception). Osteoporosis and menopause  Osteoporosis is a disease in which the bones lose minerals and strength with aging. This can result in serious bone fractures. Your risk for osteoporosis can be identified using a bone density scan.  If you are 30 years of age or older, or if you are at risk for osteoporosis and fractures, ask your health care provider if you should be screened.  Ask your health care provider whether you should take a calcium or vitamin D supplement to lower your risk for osteoporosis.  Menopause may have certain physical symptoms and risks.  Hormone replacement therapy may reduce some of these symptoms and risks. Talk to your health care provider about whether hormone replacement therapy is right for you. Follow these instructions at home:  Schedule regular health, dental, and eye exams.  Stay current with your immunizations.  Do not use any tobacco products including cigarettes, chewing tobacco, or electronic cigarettes.  If you are pregnant, do not drink alcohol.  If you are breastfeeding, limit how much and how often you drink alcohol.  Limit alcohol intake to no more than 1 drink per day for  nonpregnant women. One drink equals 12 ounces of beer, 5 ounces of wine, or 1 ounces of hard liquor.  Do not use street drugs.  Do not share needles.  Ask your health care provider for help if you need support or information about quitting drugs.  Tell your health care provider if you often feel depressed.  Tell your health care provider if you have ever been abused or do not feel safe at home. This information is not intended to replace advice given to you by your health care provider. Make sure you discuss any questions you have with your health care provider. Document Released: 06/21/2011 Document Revised: 05/13/2016 Document Reviewed: 09/09/2015 Elsevier Interactive Patient Education  2018 [employer]. Kainat Pizana M.D.

## 2018-05-12 ENCOUNTER — Ambulatory Visit (INDEPENDENT_AMBULATORY_CARE_PROVIDER_SITE_OTHER): Payer: BC Managed Care – PPO | Admitting: Internal Medicine

## 2018-05-12 ENCOUNTER — Encounter: Payer: Self-pay | Admitting: Internal Medicine

## 2018-05-12 VITALS — BP 118/82 | HR 65 | Temp 98.2°F | Ht 62.75 in | Wt 144.6 lb

## 2018-05-12 DIAGNOSIS — Z0000 Encounter for general adult medical examination without abnormal findings: Secondary | ICD-10-CM

## 2018-05-12 DIAGNOSIS — E785 Hyperlipidemia, unspecified: Secondary | ICD-10-CM

## 2018-05-12 DIAGNOSIS — R399 Unspecified symptoms and signs involving the genitourinary system: Secondary | ICD-10-CM | POA: Diagnosis not present

## 2018-05-12 DIAGNOSIS — E119 Type 2 diabetes mellitus without complications: Secondary | ICD-10-CM

## 2018-05-12 DIAGNOSIS — Z79899 Other long term (current) drug therapy: Secondary | ICD-10-CM | POA: Diagnosis not present

## 2018-05-12 DIAGNOSIS — I10 Essential (primary) hypertension: Secondary | ICD-10-CM

## 2018-05-12 LAB — HEPATIC FUNCTION PANEL
ALK PHOS: 52 U/L (ref 39–117)
ALT: 13 U/L (ref 0–35)
AST: 13 U/L (ref 0–37)
Albumin: 4.4 g/dL (ref 3.5–5.2)
BILIRUBIN DIRECT: 0.1 mg/dL (ref 0.0–0.3)
TOTAL PROTEIN: 7.2 g/dL (ref 6.0–8.3)
Total Bilirubin: 0.8 mg/dL (ref 0.2–1.2)

## 2018-05-12 LAB — BASIC METABOLIC PANEL
BUN: 14 mg/dL (ref 6–23)
CALCIUM: 10 mg/dL (ref 8.4–10.5)
CHLORIDE: 99 meq/L (ref 96–112)
CO2: 32 mEq/L (ref 19–32)
Creatinine, Ser: 0.73 mg/dL (ref 0.40–1.20)
GFR: 88.89 mL/min (ref >60.00)
Glucose, Bld: 111 mg/dL — ABNORMAL HIGH (ref 70–99)
POTASSIUM: 3.9 meq/L (ref 3.5–5.1)
SODIUM: 140 meq/L (ref 135–145)

## 2018-05-12 LAB — CBC WITH DIFFERENTIAL/PLATELET
BASOS ABS: 0 10*3/uL (ref 0.0–0.1)
Basophils Relative: 0.3 % (ref 0.0–3.0)
EOS PCT: 1.6 % (ref 0.0–5.0)
Eosinophils Absolute: 0.1 10*3/uL (ref 0.0–0.7)
HEMATOCRIT: 40.9 % (ref 36.0–46.0)
Hemoglobin: 13.9 g/dL (ref 12.0–15.0)
LYMPHS PCT: 36.5 % (ref 12.0–46.0)
Lymphs Abs: 2.1 10*3/uL (ref 0.7–4.0)
MCHC: 33.9 g/dL (ref 30.0–36.0)
MCV: 90.8 fl (ref 78.0–100.0)
MONOS PCT: 4.9 % (ref 3.0–12.0)
Monocytes Absolute: 0.3 10*3/uL (ref 0.1–1.0)
NEUTROS ABS: 3.3 10*3/uL (ref 1.4–7.7)
Neutrophils Relative %: 56.7 % (ref 43.0–77.0)
PLATELETS: 259 10*3/uL (ref 150.0–400.0)
RBC: 4.5 Mil/uL (ref 3.87–5.11)
RDW: 13.5 % (ref 11.5–15.5)
WBC: 5.8 10*3/uL (ref 4.0–10.5)

## 2018-05-12 LAB — LIPID PANEL
CHOL/HDL RATIO: 4
Cholesterol: 230 mg/dL — ABNORMAL HIGH (ref 0–200)
HDL: 57.3 mg/dL (ref >39.00)
LDL Cholesterol: 135 mg/dL — ABNORMAL HIGH (ref 0–99)
NONHDL: 172.6
Triglycerides: 188 mg/dL — ABNORMAL HIGH (ref 0.0–149.0)
VLDL: 37.6 mg/dL (ref 0.0–40.0)

## 2018-05-12 LAB — MICROALBUMIN / CREATININE URINE RATIO
Creatinine,U: 51 mg/dL
MICROALB/CREAT RATIO: 51.5 mg/g — AB (ref 0.0–30.0)
Microalb, Ur: 26.3 mg/dL — ABNORMAL HIGH (ref 0.0–1.9)

## 2018-05-12 LAB — POC URINALSYSI DIPSTICK (AUTOMATED)
BILIRUBIN UA: NEGATIVE
Blood, UA: NEGATIVE
GLUCOSE UA: NEGATIVE
KETONES UA: NEGATIVE
LEUKOCYTES UA: NEGATIVE
Nitrite, UA: NEGATIVE
PROTEIN UA: POSITIVE — AB
Spec Grav, UA: 1.015 (ref 1.010–1.025)
Urobilinogen, UA: 0.2 E.U./dL
pH, UA: 6.5 (ref 5.0–8.0)

## 2018-05-12 LAB — TSH: TSH: 1.83 u[IU]/mL (ref 0.35–4.50)

## 2018-05-12 LAB — HEMOGLOBIN A1C: Hgb A1c MFr Bld: 6 % (ref 4.6–6.5)

## 2018-05-12 MED ORDER — LISINOPRIL-HYDROCHLOROTHIAZIDE 20-25 MG PO TABS: 1.0000 | ORAL_TABLET | Freq: Every day | ORAL | 3 refills | Status: DC

## 2018-05-12 MED ORDER — SITAGLIPTIN PHOS-METFORMIN HCL 50-1000 MG PO TABS: 2.0000 | ORAL_TABLET | Freq: Every day | ORAL | 3 refills | Status: DC

## 2018-05-12 NOTE — Patient Instructions (Addendum)
Get your colonoscopy.     Will notify you  of labs when available.   I agree that your neck  Issues is mechanical and  Look at postural  Management .   Usually get  Pneumococcal vaccine with DM  .      Health Maintenance, Female Adopting a healthy lifestyle and getting preventive care can go a long way to promote health and wellness. Talk with your health care provider about what schedule of regular examinations is right for you. This is a good chance for you to check in with your provider about disease prevention and staying healthy. In between checkups, there are plenty of things you can do on your own. Experts have done a lot of research about which lifestyle changes and preventive measures are most likely to keep you healthy. Ask your health care provider for more information. Weight and diet Eat a healthy diet  Be sure to include plenty of vegetables, fruits, low-fat dairy products, and lean protein.  Do not eat a lot of foods high in solid fats, added sugars, or salt.  Get regular exercise. This is one of the most important things you can do for your health. ? Most adults should exercise for at least 150 minutes each week. The exercise should increase your heart rate and make you sweat (moderate-intensity exercise). ? Most adults should also do strengthening exercises at least twice a week. This is in addition to the moderate-intensity exercise.  Maintain a healthy weight  Body mass index (BMI) is a measurement that can be used to identify possible weight problems. It estimates body fat based on height and weight. Your health care provider can help determine your BMI and help you achieve or maintain a healthy weight.  For females 49 years of age and older: ? A BMI below 18.5 is considered underweight. ? A BMI of 18.5 to 24.9 is normal. ? A BMI of 25 to 29.9 is considered overweight. ? A BMI of 30 and above is considered obese.  Watch levels of cholesterol and blood  lipids  You should start having your blood tested for lipids and cholesterol at 52 years of age, then have this test every 5 years.  You may need to have your cholesterol levels checked more often if: ? Your lipid or cholesterol levels are high. ? You are older than 52 years of age. ? You are at high risk for heart disease.  Cancer screening Lung Cancer  Lung cancer screening is recommended for adults 55-68 years old who are at high risk for lung cancer because of a history of smoking.  A yearly low-dose CT scan of the lungs is recommended for people who: ? Currently smoke. ? Have quit within the past 15 years. ? Have at least a 30-pack-year history of smoking. A pack year is smoking an average of one pack of cigarettes a day for 1 year.  Yearly screening should continue until it has been 15 years since you quit.  Yearly screening should stop if you develop a health problem that would prevent you from having lung cancer treatment.  Breast Cancer  Practice breast self-awareness. This means understanding how your breasts normally appear and feel.  It also means doing regular breast self-exams. Let your health care provider know about any changes, no matter how small.  If you are in your 20s or 30s, you should have a clinical breast exam (CBE) by a health care provider every 1-3 years as part of a  regular health exam.  If you are 40 or older, have a CBE every year. Also consider having a breast X-ray (mammogram) every year.  If you have a family history of breast cancer, talk to your health care provider about genetic screening.  If you are at high risk for breast cancer, talk to your health care provider about having an MRI and a mammogram every year.  Breast cancer gene (BRCA) assessment is recommended for women who have family members with BRCA-related cancers. BRCA-related cancers include: ? Breast. ? Ovarian. ? Tubal. ? Peritoneal cancers.  Results of the assessment will  determine the need for genetic counseling and BRCA1 and BRCA2 testing.  Cervical Cancer Your health care provider may recommend that you be screened regularly for cancer of the pelvic organs (ovaries, uterus, and vagina). This screening involves a pelvic examination, including checking for microscopic changes to the surface of your cervix (Pap test). You may be encouraged to have this screening done every 3 years, beginning at age 61.  For women ages 32-65, health care providers may recommend pelvic exams and Pap testing every 3 years, or they may recommend the Pap and pelvic exam, combined with testing for human papilloma virus (HPV), every 5 years. Some types of HPV increase your risk of cervical cancer. Testing for HPV may also be done on women of any age with unclear Pap test results.  Other health care providers may not recommend any screening for nonpregnant women who are considered low risk for pelvic cancer and who do not have symptoms. Ask your health care provider if a screening pelvic exam is right for you.  If you have had past treatment for cervical cancer or a condition that could lead to cancer, you need Pap tests and screening for cancer for at least 20 years after your treatment. If Pap tests have been discontinued, your risk factors (such as having a new sexual partner) need to be reassessed to determine if screening should resume. Some women have medical problems that increase the chance of getting cervical cancer. In these cases, your health care provider may recommend more frequent screening and Pap tests.  Colorectal Cancer  This type of cancer can be detected and often prevented.  Routine colorectal cancer screening usually begins at 52 years of age and continues through 52 years of age.  Your health care provider may recommend screening at an earlier age if you have risk factors for colon cancer.  Your health care provider may also recommend using home test kits to check  for hidden blood in the stool.  A small camera at the end of a tube can be used to examine your colon directly (sigmoidoscopy or colonoscopy). This is done to check for the earliest forms of colorectal cancer.  Routine screening usually begins at age 21.  Direct examination of the colon should be repeated every 5-10 years through 52 years of age. However, you may need to be screened more often if early forms of precancerous polyps or small growths are found.  Skin Cancer  Check your skin from head to toe regularly.  Tell your health care provider about any new moles or changes in moles, especially if there is a change in a mole's shape or color.  Also tell your health care provider if you have a mole that is larger than the size of a pencil eraser.  Always use sunscreen. Apply sunscreen liberally and repeatedly throughout the day.  Protect yourself by wearing long sleeves, pants,  a wide-brimmed hat, and sunglasses whenever you are outside.  Heart disease, diabetes, and high blood pressure  High blood pressure causes heart disease and increases the risk of stroke. High blood pressure is more likely to develop in: ? People who have blood pressure in the high end of the normal range (130-139/85-89 mm Hg). ? People who are overweight or obese. ? People who are African American.  If you are 35-32 years of age, have your blood pressure checked every 3-5 years. If you are 1 years of age or older, have your blood pressure checked every year. You should have your blood pressure measured twice-once when you are at a hospital or clinic, and once when you are not at a hospital or clinic. Record the average of the two measurements. To check your blood pressure when you are not at a hospital or clinic, you can use: ? An automated blood pressure machine at a pharmacy. ? A home blood pressure monitor.  If you are between 27 years and 28 years old, ask your health care provider if you should take  aspirin to prevent strokes.  Have regular diabetes screenings. This involves taking a blood sample to check your fasting blood sugar level. ? If you are at a normal weight and have a low risk for diabetes, have this test once every three years after 52 years of age. ? If you are overweight and have a high risk for diabetes, consider being tested at a younger age or more often. Preventing infection Hepatitis B  If you have a higher risk for hepatitis B, you should be screened for this virus. You are considered at high risk for hepatitis B if: ? You were born in a country where hepatitis B is common. Ask your health care provider which countries are considered high risk. ? Your parents were born in a high-risk country, and you have not been immunized against hepatitis B (hepatitis B vaccine). ? You have HIV or AIDS. ? You use needles to inject street drugs. ? You live with someone who has hepatitis B. ? You have had sex with someone who has hepatitis B. ? You get hemodialysis treatment. ? You take certain medicines for conditions, including cancer, organ transplantation, and autoimmune conditions.  Hepatitis C  Blood testing is recommended for: ? Everyone born from [birth year] through 1965. ? Anyone with known risk factors for hepatitis C.  Sexually transmitted infections (STIs)  You should be screened for sexually transmitted infections (STIs) including gonorrhea and chlamydia if: ? You are sexually active and are younger than 52 years of age. ? You are older than 52 years of age and your health care provider tells you that you are at risk for this type of infection. ? Your sexual activity has changed since you were last screened and you are at an increased risk for chlamydia or gonorrhea. Ask your health care provider if you are at risk.  If you do not have HIV, but are at risk, it may be recommended that you take a prescription medicine daily to prevent HIV infection. This is called  pre-exposure prophylaxis (PrEP). You are considered at risk if: ? You are sexually active and do not regularly use condoms or know the HIV status of your partner(s). ? You take drugs by injection. ? You are sexually active with a partner who has HIV.  Talk with your health care provider about whether you are at high risk of being infected with HIV. If you choose  to begin PrEP, you should first be tested for HIV. You should then be tested every 3 months for as long as you are taking PrEP. Pregnancy  If you are premenopausal and you may become pregnant, ask your health care provider about preconception counseling.  If you may become pregnant, take 400 to 800 micrograms (mcg) of folic acid every day.  If you want to prevent pregnancy, talk to your health care provider about birth control (contraception). Osteoporosis and menopause  Osteoporosis is a disease in which the bones lose minerals and strength with aging. This can result in serious bone fractures. Your risk for osteoporosis can be identified using a bone density scan.  If you are 88 years of age or older, or if you are at risk for osteoporosis and fractures, ask your health care provider if you should be screened.  Ask your health care provider whether you should take a calcium or vitamin D supplement to lower your risk for osteoporosis.  Menopause may have certain physical symptoms and risks.  Hormone replacement therapy may reduce some of these symptoms and risks. Talk to your health care provider about whether hormone replacement therapy is right for you. Follow these instructions at home:  Schedule regular health, dental, and eye exams.  Stay current with your immunizations.  Do not use any tobacco products including cigarettes, chewing tobacco, or electronic cigarettes.  If you are pregnant, do not drink alcohol.  If you are breastfeeding, limit how much and how often you drink alcohol.  Limit alcohol intake to no more  than 1 drink per day for nonpregnant women. One drink equals 12 ounces of beer, 5 ounces of wine, or 1 ounces of hard liquor.  Do not use street drugs.  Do not share needles.  Ask your health care provider for help if you need support or information about quitting drugs.  Tell your health care provider if you often feel depressed.  Tell your health care provider if you have ever been abused or do not feel safe at home. This information is not intended to replace advice given to you by your health care provider. Make sure you discuss any questions you have with your health care provider. Document Released: 06/21/2011 Document Revised: 05/13/2016 Document Reviewed: 09/09/2015 Elsevier Interactive Patient Education  [employer].

## 2018-05-13 ENCOUNTER — Telehealth: Payer: Self-pay | Admitting: Internal Medicine

## 2018-05-13 NOTE — Telephone Encounter (Signed)
[facility] FAX:   "Caller with [facility] got a prescription for Janument but they saw the pt was previously on the XR version. Is wanting to make sure it is not a mistake. Dr. Berniece Andreas." Pharmacist name: Maudie Mercury, phone number [phone]

## 2018-05-16 MED ORDER — SITAGLIP PHOS-METFORMIN HCL ER 50-1000 MG PO TB24: 2.0000 | ORAL_TABLET | Freq: Every day | ORAL | 3 refills | Status: DC

## 2018-05-16 NOTE — Telephone Encounter (Signed)
Pt has been getting Janumet 50-1000mg  TB24hr since 2017.  Most recent Rx sent was for regular Janumet.  Was this a mistake Dr Fabian Sharp or were you switching her back to the regular? Pt seen in office 05/12/18 for CPE when rx was sent.

## 2018-05-16 NOTE — Telephone Encounter (Signed)
Did   Not mean to change  Please  Go back to the original rx of whatever she was on before .  Thanks

## 2018-05-16 NOTE — Addendum Note (Signed)
Addended by: Maisie Fus on: 05/16/2018 01:08 PM   Modules accepted: Orders

## 2018-05-16 NOTE — Telephone Encounter (Signed)
Medication changed. Pharmacy notified and regular Janumet cancelled at pharmacy. Nothing further needed.

## 2018-05-24 ENCOUNTER — Other Ambulatory Visit: Payer: Self-pay | Admitting: *Deleted

## 2018-06-02 MED ORDER — PRAVASTATIN SODIUM 20 MG PO TABS: 20.0000 mg | ORAL_TABLET | Freq: Every day | ORAL | 1 refills | Status: DC

## 2018-06-02 NOTE — Addendum Note (Signed)
Addended by: Johnella MoloneyFUNDERBURK, Shamell Hittle A on: 06/02/2018 03:46 PM   Modules accepted: Orders

## 2018-06-02 NOTE — Addendum Note (Signed)
Addended by: Johnella MoloneyFUNDERBURK, JO A on: 06/02/2018 09:01 AM   Modules accepted: Orders

## 2018-09-15 ENCOUNTER — Other Ambulatory Visit: Payer: Self-pay | Admitting: Internal Medicine

## 2018-09-19 ENCOUNTER — Telehealth: Payer: Self-pay | Admitting: Internal Medicine

## 2018-09-19 NOTE — Telephone Encounter (Signed)
Last filled 06/02/18, #90 w/ 1 RF. Per pharmacy pt picked up scripts on 06/03/18 and 08/10/18. She should not be out of medication if taking as prescribed.   Called patient and left message to return call. Is she out of medication? If not out, she should come in for fasting lipid panel prior to next refill.

## 2018-09-19 NOTE — Telephone Encounter (Signed)
Copied from CRM [id number]. Topic: Quick Communication - Rx Refill/Question >> Sep 19, 2018  3:07 PM Arlyss Gandy, NT wrote: Medication: pravastatin (PRAVACHOL) 20 MG tablet  Has the patient contacted their pharmacy? Yes.   (Agent: If no, request that the patient contact the pharmacy for the refill.) (Agent: If yes, when and what did the pharmacy advise?)  Preferred Pharmacy (with phone number or street name): [facility] [address], [place] - [address] [phone] (Phone) [phone] (Fax)    Agent: Please be advised that RX refills may take up to 3 business days. We ask that you follow-up with your pharmacy.

## 2018-09-20 MED ORDER — PRAVASTATIN SODIUM 20 MG PO TABS: 20.0000 mg | ORAL_TABLET | Freq: Every day | ORAL | 0 refills | Status: DC

## 2018-09-20 NOTE — Telephone Encounter (Signed)
Patient was in [place] and [place] for one month and she ate the foods there and had a little chest pain and she was told by her Cardiologist to take two of the tablets if she experienced chest pain.  So that's why she is out of medication. She said she felt much better when she took 2 tablets during this time.  She has been out of medication for a week.

## 2018-09-20 NOTE — Telephone Encounter (Signed)
Can do refill for one month of 20 mg. Follow up with Dr. Fabian Sharp for further assessment

## 2018-09-20 NOTE — Telephone Encounter (Signed)
Medication filled to pharmacy as requested.   

## 2018-09-20 NOTE — Telephone Encounter (Signed)
Patient is currently rx'd pravastatin 20 mg daily from Dr. Fabian Sharp. Was previously taking pravastatin 40 mg daily from Dr. Duke Salvia (cards). Dr. Fabian Sharp decreased dose due to myalgias. However, patient was taking two 20 mg tabs while in [place], so she was back up to 40 mg.  Please advise refill and dose/sig if okay. Or should she come in for fasting lipid panel prior to refilling? Thanks!

## 2018-10-16 ENCOUNTER — Ambulatory Visit (INDEPENDENT_AMBULATORY_CARE_PROVIDER_SITE_OTHER): Payer: BC Managed Care – PPO

## 2018-10-16 ENCOUNTER — Encounter: Payer: Self-pay | Admitting: Internal Medicine

## 2018-10-16 ENCOUNTER — Other Ambulatory Visit: Payer: BC Managed Care – PPO

## 2018-10-16 ENCOUNTER — Ambulatory Visit: Payer: BC Managed Care – PPO | Admitting: Internal Medicine

## 2018-10-16 VITALS — BP 108/60 | HR 61 | Temp 98.1°F | Wt 144.8 lb

## 2018-10-16 DIAGNOSIS — I10 Essential (primary) hypertension: Secondary | ICD-10-CM

## 2018-10-16 DIAGNOSIS — E119 Type 2 diabetes mellitus without complications: Secondary | ICD-10-CM

## 2018-10-16 DIAGNOSIS — Z23 Encounter for immunization: Secondary | ICD-10-CM | POA: Diagnosis not present

## 2018-10-16 DIAGNOSIS — Z79899 Other long term (current) drug therapy: Secondary | ICD-10-CM

## 2018-10-16 DIAGNOSIS — R0789 Other chest pain: Secondary | ICD-10-CM

## 2018-10-16 LAB — CBC WITH DIFFERENTIAL/PLATELET
Basophils Absolute: 0 10*3/uL (ref 0.0–0.1)
Basophils Relative: 0.4 % (ref 0.0–3.0)
EOS ABS: 0.1 10*3/uL (ref 0.0–0.7)
Eosinophils Relative: 0.8 % (ref 0.0–5.0)
HEMATOCRIT: 38.1 % (ref 36.0–46.0)
HEMOGLOBIN: 13.4 g/dL (ref 12.0–15.0)
Lymphocytes Relative: 29.4 % (ref 12.0–46.0)
Lymphs Abs: 2.1 10*3/uL (ref 0.7–4.0)
MCHC: 35.1 g/dL (ref 30.0–36.0)
MCV: 89.1 fl (ref 78.0–100.0)
Monocytes Absolute: 0.4 10*3/uL (ref 0.1–1.0)
Monocytes Relative: 5.1 % (ref 3.0–12.0)
NEUTROS ABS: 4.7 10*3/uL (ref 1.4–7.7)
Neutrophils Relative %: 64.3 % (ref 43.0–77.0)
PLATELETS: 216 10*3/uL (ref 150.0–400.0)
RBC: 4.27 Mil/uL (ref 3.87–5.11)
RDW: 13.8 % (ref 11.5–15.5)
WBC: 7.3 10*3/uL (ref 4.0–10.5)

## 2018-10-16 LAB — BASIC METABOLIC PANEL
BUN: 10 mg/dL (ref 6–23)
CALCIUM: 9.4 mg/dL (ref 8.4–10.5)
CO2: 29 mEq/L (ref 19–32)
CREATININE: 0.69 mg/dL (ref 0.40–1.20)
Chloride: 98 mEq/L (ref 96–112)
GFR: 94.71 mL/min (ref >60.00)
GLUCOSE: 110 mg/dL — AB (ref 70–99)
Potassium: 3.6 mEq/L (ref 3.5–5.1)
SODIUM: 136 meq/L (ref 135–145)

## 2018-10-16 LAB — LIPID PANEL
CHOL/HDL RATIO: 3
CHOLESTEROL: 174 mg/dL (ref 0–200)
HDL: 58.8 mg/dL (ref >39.00)
LDL CALC: 83 mg/dL (ref 0–99)
NonHDL: 115.4
Triglycerides: 164 mg/dL — ABNORMAL HIGH (ref 0.0–149.0)
VLDL: 32.8 mg/dL (ref 0.0–40.0)

## 2018-10-16 LAB — HEMOGLOBIN A1C: Hgb A1c MFr Bld: 6 % (ref 4.6–6.5)

## 2018-10-16 MED ORDER — PRAVASTATIN SODIUM 20 MG PO TABS: 20.0000 mg | ORAL_TABLET | Freq: Every day | ORAL | 12 refills | Status: DC

## 2018-10-16 NOTE — Progress Notes (Signed)
Chief Complaint  Patient presents with  . Upper Body Pain    x 2-3 months of neck, shoulder and upper chest pain. Pt has been taking 40mg  of Pravastatin a day when symptoms are worse and then back down to 20mg   when symptoms are not present. Pt states that she had this type of pain previously and had a heart work up which was all normal.     HPI: Amber [name] 52 y.o. come in for  sda     Had    Neg cv eval   See above.   sda concern about  Pain mid left neck to upper back  Off and on since 15 day cruise to [place] and ate well. No injury but pain better with laying downa nd walking  When she feels good  ocass burinng mid chest .  Father had back pain with his diabets and concern .  kidney function etc   Taking prava 20 - 40    runnin gout.    No fever   ROS: See pertinent positives and negatives per HPI. No vomiting witht los sweats sob cough fever   Past Medical History:  Diagnosis Date  . Anemia   . Diabetes mellitus   . Hyperlipidemia   . Hypertension     Family History  Problem Relation Age of Onset  . Hypertension Mother   . CAD Mother   . Diabetes Father   . CAD Father   . Diabetes Other        Grandparent  . Stroke Other        Grandparent  . Hyperlipidemia Unknown        parents  . Developmental delay Daughter   . Stroke Maternal Grandmother   . Hypertension Maternal Grandmother   . Diabetes Paternal Grandmother   . Kidney failure Paternal Grandmother     Social History   Socioeconomic History  . Marital status: Married    Spouse name: Not on file  . Number of children: 1  . Years of education: BS  . Highest education level: Not on file  Occupational History  . Occupation: Acupuncturist  Social Needs  . Financial resource strain: Not on file  . Food insecurity:    Worry: Not on file    Inability: Not on file  . Transportation needs:    Medical: Not on file    Non-medical: Not on file  Tobacco Use  . Smoking status: Never Smoker  . Smokeless tobacco:  Never Used  Substance and Sexual Activity  . Alcohol use: No  . Drug use: No  . Sexual activity: Yes    Birth control/protection: Surgical  Lifestyle  . Physical activity:    Days per week: Not on file    Minutes per session: Not on file  . Stress: Not on file  Relationships  . Social connections:    Talks on phone: Not on file    Gets together: Not on file    Attends religious service: Not on file    Active member of club or organization: Not on file    Attends meetings of clubs or organizations: Not on file    Relationship status: Not on file  Other Topics Concern  . 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     Outpatient Medications Prior to Visit  Medication Sig Dispense Refill  . aspirin EC 81 MG tablet Take 81 mg by mouth daily.    Marland Kitchen lisinopril-hydrochlorothiazide (PRINZIDE,ZESTORETIC) 20-25 MG tablet Take 1 tablet by mouth daily. 90 tablet 3  . Multiple Vitamin (MULTIVITAMIN) capsule Take 1 capsule by mouth.    . SitaGLIPtin-MetFORMIN HCl (JANUMET XR) 50-1000 MG TB24 Take 2 tablets by mouth daily. 60 tablet 3  . pravastatin (PRAVACHOL) 20 MG tablet Take 1 tablet (20 mg total) by mouth daily. 30 tablet 0  . pravastatin (PRAVACHOL) 40 MG tablet Take 1 tablet (40 mg total) by mouth every evening. 30 tablet 5   No facility-administered medications prior to visit.      EXAM:  BP 108/60 (BP Location: Right Arm, Patient Position: Sitting, Cuff Size: Normal)   Pulse 61   Temp 98.1 F (36.7 C) (Oral)   Wt 144 lb 12.8 oz (65.7 kg)   SpO2 (!) 61%   BMI 25.85 kg/m   Body mass index is 25.85 kg/m.  GENERAL: vitals reviewed and listed above, alert, oriented, appears well hydrated and in no acute distress mild anxiety looks well   HEENT: atraumatic, conjunctiva   clear, no obvious abnormalities on inspection of external nose and ears OP : no lesion edema or exudate  Pt feelt prominent veins under tongue but look nl to me  NECK: no obvious masses on inspection palpation  LUNGS: clear to auscultation bilaterally, no wheezes, rales or rhonchi, good air movement CV: HRRR, no clubbing cyanosis or  peripheral edema nl cap refill  somediscomfort when riotation and brach  Stretch no point tenderness  Abdomen:  Sof,t normal bowel sounds without hepatosplenomegaly, no guarding rebound or masses no CVA tenderness  MS: moves all extremities without noticeable focal  abnormality PSYCH: pleasant and cooperative, no obvious depression or anxiety Lab Results  Component Value Date   WBC 5.8 05/12/2018   HGB 13.9 05/12/2018   HCT 40.9 05/12/2018   PLT 259.0 05/12/2018   GLUCOSE 111 (H) 05/12/2018   CHOL 230 (H) 05/12/2018   TRIG 188.0 (H) 05/12/2018   HDL 57.30 05/12/2018   LDLDIRECT 118.0 10/20/2016   LDLCALC 135 (H) 05/12/2018   ALT 13 05/12/2018   AST 13 05/12/2018   NA 140 05/12/2018   K 3.9 05/12/2018   CL 99 05/12/2018   CREATININE 0.73 05/12/2018   BUN 14 05/12/2018   CO2 32 05/12/2018   TSH 1.83 05/12/2018   INR 1.03 09/12/2011   HGBA1C 6.0 05/12/2018   MICROALBUR 26.3 (H) 05/12/2018   BP Readings from Last 3 Encounters:  10/16/18 108/60  05/12/18 118/82  05/04/17 104/70    ASSESSMENT AND PLAN:  Discussed the following assessment and plan:  Intermittent left-sided chest pain - neck nad upper back see text  - Plan: Lipid panel, Basic metabolic panel, Hemoglobin A1c, CBC with Differential/Platelet, DG Chest 2 View  Need for influenza vaccination - Plan: Flu Vaccine QUAD 6+ mos PF IM (Fluarix Quad PF)  Medication management - Plan: Lipid panel, Basic metabolic panel, Hemoglobin A1c, CBC with Differential/Platelet, DG Chest 2 View  Essential hypertension - Plan: Lipid panel, Basic metabolic panel, Hemoglobin A1c, CBC with  Differential/Platelet, DG Chest 2 View  Diabetes mellitus without complication (HCC) - Plan: Lipid panel, Basic metabolic panel, Hemoglobin A1c, CBC with Differential/Platelet, DG Chest 2 View Exam cw ms   Dose  have risk  Prev eval reassuring disc use of statin   Will check metabolic parameters sna chest x ray   Can see specialist if ongoing ms sx  And cards  If needed . Plan follow up depending on results and how doing  -Patient advised to return or notify health care team  if  new concerns arise.  Patient Instructions  I think your pain is  musculo skeletal nerver related based on  Context  And exam   the stress stest last year is reassuring  . If concerning  you can get  back and see cardiology for a  Follow up.    Advise stay on statin medication   And do lab testings today with chest x ray to be sure .   stretching and exercise may be the best for now .     Neta Mends. Tamberlyn Midgley M.D.

## 2018-10-16 NOTE — Patient Instructions (Signed)
I think your pain is  musculo skeletal nerver related based on  Context  And exam   the stress stest last year is reassuring  . If concerning  you can get  back and see cardiology for a  Follow up.    Advise stay on statin medication   And do lab testings today with chest x ray to be sure .   stretching and exercise may be the best for now .

## 2018-10-17 ENCOUNTER — Encounter: Payer: Self-pay | Admitting: *Deleted

## 2018-11-28 ENCOUNTER — Encounter: Payer: Self-pay | Admitting: Family Medicine

## 2018-11-28 ENCOUNTER — Ambulatory Visit: Payer: BC Managed Care – PPO | Admitting: Family Medicine

## 2018-11-28 VITALS — BP 120/80 | HR 62 | Temp 98.4°F | Ht 62.75 in | Wt 149.6 lb

## 2018-11-28 DIAGNOSIS — J029 Acute pharyngitis, unspecified: Secondary | ICD-10-CM | POA: Diagnosis not present

## 2018-11-28 LAB — POCT RAPID STREP A (OFFICE): Rapid Strep A Screen: POSITIVE — AB

## 2018-11-28 MED ORDER — AMOXICILLIN 500 MG PO CAPS: 500.0000 mg | ORAL_CAPSULE | Freq: Three times a day (TID) | ORAL | 0 refills | Status: DC

## 2018-11-28 NOTE — Patient Instructions (Addendum)
BEFORE YOU LEAVE: -strep test -follow up: I hope you are feeling better soon!   Strep Throat Strep throat is an infection of the throat. It is caused by germs. Strep throat spreads from person to person because of coughing, sneezing, or close contact. Follow these instructions at home: Medicines  Take over-the-counter and prescription medicines only as told by your doctor.  Take your antibiotic medicine as told by your doctor. Do not stop taking the medicine even if you feel better.  Have family members who also have a sore throat or fever go to a doctor. Eating and drinking  Do not share food, drinking cups, or personal items.  Try eating soft foods until your sore throat feels better.  Drink enough fluid to keep your pee (urine) clear or pale yellow. General instructions  Rinse your mouth (gargle) with a salt-water mixture 3-4 times per day or as needed. To make a salt-water mixture, stir -1 tsp of salt into 1 cup of warm water.  Make sure that all people in your house wash their hands well.  Rest.  Stay home from school or work until you have been taking antibiotics for 24 hours.  Keep all follow-up visits as told by your doctor. This is important. Contact a doctor if:  Your neck keeps getting bigger.  You get a rash, cough, or earache.  You cough up thick liquid that is green, yellow-brown, or bloody.  You have pain that does not get better with medicine.  Your problems get worse instead of getting better.  You have a fever. Get help right away if:  You throw up (vomit).  You get a very bad headache.  You neck hurts or it feels stiff.  You have chest pain or you are short of breath.  You have drooling, very bad throat pain, or changes in your voice.  Your neck is swollen or the skin gets red and tender.  Your mouth is dry or you are peeing less than normal.  You keep feeling more tired or it is hard to wake up.  Your joints are red or they  hurt. This information is not intended to replace advice given to you by your health care provider. Make sure you discuss any questions you have with your health care provider. Document Released: 05/24/2008 Document Revised: 08/04/2016 Document Reviewed: 03/31/2015 Elsevier Interactive Patient Education  [employer]2018 Elsevier Inc.

## 2018-11-28 NOTE — Progress Notes (Signed)
HPI:  Using dictation device. Unfortunately this device frequently misinterprets words/phrases.   Acute visit for respiratory illness: -started: 4 days ago -symptoms:nasal congestion, sore throat, cough, PND -denies:fever, SOB, NVD, tooth pain, body aches -has tried: no rx -sick contacts/travel/risks: no reported flu, strep or tick exposure -Hx of: no hx asthma  ROS: See pertinent positives and negatives per HPI.  Past Medical History:  Diagnosis Date  . Anemia   . Diabetes mellitus   . Hyperlipidemia   . Hypertension     Past Surgical History:  Procedure Laterality Date  . ablastion  09/2011  . blood trans  08/2011  . DILATION AND CURETTAGE OF UTERUS  09/2011  . NO PAST SURGERIES      Family History  Problem Relation Age of Onset  . Hypertension Mother   . CAD Mother   . Diabetes Father   . CAD Father   . Diabetes Other        Grandparent  . Stroke Other        Grandparent  . Hyperlipidemia Unknown        parents  . Developmental delay Daughter   . Stroke Maternal Grandmother   . Hypertension Maternal Grandmother   . Diabetes Paternal Grandmother   . Kidney failure Paternal Grandmother     Social History   Socioeconomic History  . Marital status: Married    Spouse name: Not on file  . Number of children: 1  . Years of education: BS  . Highest education level: Not on file  Occupational History  . Occupation: Acupuncturist  Social Needs  . Financial resource strain: Not on file  . Food insecurity:    Worry: Not on file    Inability: Not on file  . Transportation needs:    Medical: Not on file    Non-medical: Not on file  Tobacco Use  . Smoking status: Never Smoker  . Smokeless tobacco: Never Used  Substance and Sexual Activity  . Alcohol use: No  . Drug use: No  . Sexual activity: Yes    Birth control/protection: Surgical  Lifestyle  . Physical activity:    Days per week: Not on file    Minutes per session: Not on file  . Stress: Not  on file  Relationships  . Social connections:    Talks on phone: Not on file    Gets together: Not on file    Attends religious service: Not on file    Active member of club or organization: Not on file    Attends meetings of clubs or organizations: Not on file    Relationship status: Not on file  Other Topics Concern  . 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      Current Outpatient Medications:  .  aspirin EC 81 MG tablet, Take 81 mg by mouth daily., Disp: , Rfl:  .  lisinopril-hydrochlorothiazide (PRINZIDE,ZESTORETIC) 20-25 MG tablet, Take 1 tablet by mouth daily., Disp: 90 tablet, Rfl: 3 .  Multiple Vitamin (MULTIVITAMIN) capsule, Take 1 capsule by mouth., Disp: , Rfl:  .  pravastatin (PRAVACHOL) 20 MG tablet, Take 1 tablet (20 mg total) by mouth daily. May increase to 40 mg  per day if tolerated, Disp: 30 tablet, Rfl: 12 .  SitaGLIPtin-MetFORMIN HCl (JANUMET XR) 50-1000 MG TB24, Take 2 tablets by mouth daily., Disp: 60 tablet, Rfl: 3 .  amoxicillin (AMOXIL) 500 MG capsule, Take 1 capsule (500 mg total) by mouth 3 (three) times daily., Disp: 30 capsule, Rfl: 0  EXAM:  Vitals:   11/28/18 1032  BP: 120/80  Pulse: 62  Temp: 98.4 F (36.9 C)    Body mass index is 26.71 kg/m.  GENERAL: vitals reviewed and listed above, alert, oriented, appears well hydrated and in no acute distress  HEENT: atraumatic, conjunttiva clear, no obvious abnormalities on inspection of external nose and ears, normal appearance of ear canals and TMs, clear nasal congestion, mild post oropharyngeal erythema with PND, no tonsillar edema or exudate, no sinus TTP  NECK: no obvious masses on inspection  LUNGS: clear to auscultation bilaterally, no wheezes, rales or rhonchi, good air  movement  CV: HRRR, no peripheral edema  MS: moves all extremities without noticeable abnormality  PSYCH: pleasant and cooperative, no obvious depression or anxiety  ASSESSMENT AND PLAN:  Discussed the following assessment and plan:  Sore throat - Plan: POC Rapid Strep A  -rapid strep test positive. Discussed treatment. Rx sent to pharmacy. Return precautions/tx/comp discussed. -of course, we advised to return or notify a doctor immediately if symptoms worsen or persist or new concerns arise.    Patient Instructions  BEFORE YOU LEAVE: -strep test -follow up: I hope you are feeling better soon!   Strep Throat Strep throat is an infection of the throat. It is caused by germs. Strep throat spreads from person to person because of coughing, sneezing, or close contact. Follow these instructions at home: Medicines  Take over-the-counter and prescription medicines only as told by your doctor.  Take your antibiotic medicine as told by your doctor. Do not stop taking the medicine even if you feel better.  Have family members who also have a sore throat or fever go to a doctor. Eating and drinking  Do not share food, drinking cups, or personal items.  Try eating soft foods until your sore throat feels better.  Drink enough fluid to keep your pee (urine) clear or pale yellow. General instructions  Rinse your mouth (gargle) with a salt-water mixture 3-4 times per day or as needed. To make a salt-water mixture, stir -1 tsp of salt into 1 cup of warm water.  Make sure that all people in your house wash their hands well.  Rest.  Stay home from school or work until you have been taking antibiotics for 24 hours.  Keep all follow-up visits as told by your doctor. This is important. Contact a doctor if:  Your neck keeps getting bigger.  You get a rash, cough, or earache.  You cough up thick liquid that is green, yellow-brown, or bloody.  You have pain that does not get  better with medicine.  Your problems get worse instead of getting better.  You have a fever. Get help right away if:  You throw up (vomit).  You get a very bad headache.  You neck hurts or it feels stiff.  You have chest pain or you are short of breath.  You have drooling, very bad throat pain, or changes in your voice.  Your neck is swollen or the skin gets red and tender.  Your mouth is dry or you are peeing less than normal.  You keep feeling more tired or it is hard to wake  up.  Your joints are red or they hurt. This information is not intended to replace advice given to you by your health care provider. Make sure you discuss any questions you have with your health care provider. Document Released: 05/24/2008 Document Revised: 08/04/2016 Document Reviewed: 03/31/2015 Elsevier Interactive Patient Education  2018 [employer].    Terressa Koyanagi, DO

## 2019-01-29 ENCOUNTER — Other Ambulatory Visit: Payer: Self-pay | Admitting: Internal Medicine

## 2019-05-23 ENCOUNTER — Other Ambulatory Visit: Payer: Self-pay | Admitting: Internal Medicine

## 2019-05-29 ENCOUNTER — Encounter: Payer: BC Managed Care – PPO | Admitting: Internal Medicine

## 2019-06-19 ENCOUNTER — Other Ambulatory Visit: Payer: Self-pay | Admitting: Internal Medicine

## 2019-09-08 ENCOUNTER — Other Ambulatory Visit: Payer: Self-pay | Admitting: Internal Medicine

## 2019-09-22 ENCOUNTER — Other Ambulatory Visit: Payer: Self-pay

## 2019-09-22 ENCOUNTER — Encounter (HOSPITAL_COMMUNITY): Payer: Self-pay | Admitting: Emergency Medicine

## 2019-09-22 DIAGNOSIS — I10 Essential (primary) hypertension: Secondary | ICD-10-CM | POA: Insufficient documentation

## 2019-09-22 DIAGNOSIS — R1110 Vomiting, unspecified: Secondary | ICD-10-CM | POA: Diagnosis present

## 2019-09-22 DIAGNOSIS — E119 Type 2 diabetes mellitus without complications: Secondary | ICD-10-CM | POA: Diagnosis not present

## 2019-09-22 DIAGNOSIS — R9431 Abnormal electrocardiogram [ECG] [EKG]: Secondary | ICD-10-CM | POA: Insufficient documentation

## 2019-09-22 DIAGNOSIS — R002 Palpitations: Secondary | ICD-10-CM | POA: Diagnosis not present

## 2019-09-22 DIAGNOSIS — Z79899 Other long term (current) drug therapy: Secondary | ICD-10-CM | POA: Insufficient documentation

## 2019-09-22 DIAGNOSIS — Z7984 Long term (current) use of oral hypoglycemic drugs: Secondary | ICD-10-CM | POA: Diagnosis not present

## 2019-09-22 MED ORDER — SODIUM CHLORIDE 0.9% FLUSH
3.0000 mL | Freq: Once | INTRAVENOUS | Status: AC
  Administered 2019-09-23: 02:00:00 3 mL via INTRAVENOUS

## 2019-09-22 MED ORDER — ONDANSETRON 4 MG PO TBDP
4.0000 mg | ORAL_TABLET | Freq: Once | ORAL | Status: DC | PRN
  Filled 2019-09-22: qty 1

## 2019-09-22 NOTE — ED Triage Notes (Signed)
Pt reports eating mushrooms at 8pm and then started having vomiting at 10pm pt actively vomiting at triage.

## 2019-09-23 ENCOUNTER — Emergency Department (HOSPITAL_COMMUNITY)
Admission: EM | Admit: 2019-09-23 | Discharge: 2019-09-23 | Disposition: A | Discharge status: Home / Self Care | Payer: BC Managed Care – PPO | Attending: Emergency Medicine | Admitting: Emergency Medicine

## 2019-09-23 DIAGNOSIS — R197 Diarrhea, unspecified: Secondary | ICD-10-CM

## 2019-09-23 DIAGNOSIS — R002 Palpitations: Secondary | ICD-10-CM

## 2019-09-23 DIAGNOSIS — R112 Nausea with vomiting, unspecified: Secondary | ICD-10-CM

## 2019-09-23 DIAGNOSIS — R9431 Abnormal electrocardiogram [ECG] [EKG]: Secondary | ICD-10-CM

## 2019-09-23 LAB — COMPREHENSIVE METABOLIC PANEL
ALT: 16 U/L (ref 0–44)
AST: 18 U/L (ref 15–41)
Albumin: 4.7 g/dL (ref 3.5–5.0)
Alkaline Phosphatase: 57 U/L (ref 38–126)
Anion gap: 15 (ref 5–15)
BUN: 17 mg/dL (ref 6–20)
CO2: 23 mmol/L (ref 22–32)
Calcium: 9.8 mg/dL (ref 8.9–10.3)
Chloride: 99 mmol/L (ref 98–111)
Creatinine, Ser: 0.74 mg/dL (ref 0.44–1.00)
GFR calc Af Amer: >60 mL/min (ref >60)
GFR calc non Af Amer: >60 mL/min (ref >60)
Glucose, Bld: 140 mg/dL — ABNORMAL HIGH (ref 70–99)
Potassium: 2.8 mmol/L — ABNORMAL LOW (ref 3.5–5.1)
Sodium: 137 mmol/L (ref 135–145)
Total Bilirubin: 1 mg/dL (ref 0.3–1.2)
Total Protein: 7.7 g/dL (ref 6.5–8.1)

## 2019-09-23 LAB — MAGNESIUM: Magnesium: 1.8 mg/dL (ref 1.7–2.4)

## 2019-09-23 LAB — URINALYSIS, ROUTINE W REFLEX MICROSCOPIC
Bilirubin Urine: NEGATIVE
Glucose, UA: NEGATIVE mg/dL
Hgb urine dipstick: NEGATIVE
Ketones, ur: 15 mg/dL — AB
Leukocytes,Ua: NEGATIVE
Nitrite: NEGATIVE
Protein, ur: >300 mg/dL — AB
Specific Gravity, Urine: 1.02 (ref 1.005–1.030)
pH: 8.5 — ABNORMAL HIGH (ref 5.0–8.0)

## 2019-09-23 LAB — CBC
HCT: 40.4 % (ref 36.0–46.0)
Hemoglobin: 14.1 g/dL (ref 12.0–15.0)
MCH: 30.8 pg (ref 26.0–34.0)
MCHC: 34.9 g/dL (ref 30.0–36.0)
MCV: 88.2 fL (ref 80.0–100.0)
Platelets: 278 10*3/uL (ref 150–400)
RBC: 4.58 MIL/uL (ref 3.87–5.11)
RDW: 12.6 % (ref 11.5–15.5)
WBC: 16.6 10*3/uL — ABNORMAL HIGH (ref 4.0–10.5)
nRBC: 0 % (ref 0.0–0.2)

## 2019-09-23 LAB — URINALYSIS, MICROSCOPIC (REFLEX)
Bacteria, UA: NONE SEEN
RBC / HPF: NONE SEEN RBC/hpf (ref 0–5)
WBC, UA: NONE SEEN WBC/hpf (ref 0–5)

## 2019-09-23 LAB — TROPONIN I (HIGH SENSITIVITY)
Troponin I (High Sensitivity): 3 ng/L (ref <18)
Troponin I (High Sensitivity): 3 ng/L (ref <18)

## 2019-09-23 LAB — I-STAT BETA HCG BLOOD, ED (MC, WL, AP ONLY): I-stat hCG, quantitative: <5 m[IU]/mL (ref <5)

## 2019-09-23 LAB — LIPASE, BLOOD: Lipase: 26 U/L (ref 11–51)

## 2019-09-23 MED ORDER — ONDANSETRON HCL 4 MG PO TABS: 4.0000 mg | ORAL_TABLET | Freq: Four times a day (QID) | ORAL | 0 refills | Status: DC

## 2019-09-23 MED ORDER — LACTATED RINGERS IV BOLUS
1000.0000 mL | Freq: Once | INTRAVENOUS | Status: AC
  Administered 2019-09-23: 02:00:00 1000 mL via INTRAVENOUS

## 2019-09-23 MED ORDER — POTASSIUM CHLORIDE CRYS ER 20 MEQ PO TBCR
40.0000 meq | EXTENDED_RELEASE_TABLET | Freq: Once | ORAL | Status: AC
  Administered 2019-09-23: 40 meq via ORAL
  Filled 2019-09-23: qty 2

## 2019-09-23 NOTE — ED Notes (Signed)
P.O. Challenge done. Patient tolerated well. 

## 2019-09-23 NOTE — Discharge Instructions (Signed)
You are seen in the ER for nausea, vomiting and chest palpitations. Our work-up in the ER is reassuring except for the EKG appears slightly abnormal.  We recommend that you follow-up with your primary care doctor.    Please return to the ER if you have worsening chest pain, shortness of breath, pain radiating to your jaw, shoulder, or back, sweats or fainting. Otherwise see the Cardiologist or your primary care doctor as requested.

## 2019-09-23 NOTE — ED Provider Notes (Addendum)
[facility]-EMERGENCY DEPT Provider Note   CSN: [id number] Arrival date & time: 09/22/19  2258     History   Chief Complaint Chief Complaint  Patient presents with  . Emesis    HPI Amber [name] is a 53 y.o. female.  HPI: A 53 year old patient with a history of treated diabetes, hypertension and hypercholesterolemia presents for evaluation of chest pain. Initial onset of pain was approximately 1-3 hours ago. The patient's chest pain is described as heaviness/pressure/tightness and is not worse with exertion. The patient's chest pain is not middle- or left-sided, is not well-localized, is not sharp and does not radiate to the arms/jaw/neck. The patient does not complain of nausea and denies diaphoresis. The patient has a family history of coronary artery disease in a first-degree relative with onset less than age 90. The patient has no history of stroke, has no history of peripheral artery disease, has not smoked in the past 90 days and does not have an elevated BMI (>=30).   Patient reports that she had mushrooms at 8 PM and 2 hours later she started having nausea and vomiting.  She had more than 5 episodes of vomitus and the last emesis was bilious.  She also had 2 or 3 loose bowel movements.  She also had chest palpitations and pounding feeling.  She has family history of CAD therefore she decided to come to the ER.  At the moment she does not have any chest discomfort. Pt has no hx of PE, DVT and denies any exogenous hormone (testosterone / estrogen) use, long distance travels or surgery in the past 6 weeks, active cancer, recent immobilization.  Her husband had the same mushrooms and did not have any symptoms.  Her nausea is now resolved.  She also denies any chest discomfort at the moment.  HPI  Past Medical History:  Diagnosis Date  . Anemia   . Diabetes mellitus   . Hyperlipidemia   . Hypertension     Patient Active Problem List   Diagnosis Date Noted  .  Diabetes mellitus without complication (HCC) 02/23/2015  . Chest pain 07/18/2014  . Hx of chest pain at rest 06/24/2014  . Proteinuria 06/24/2014  . Visit for preventive health examination 06/08/2013  . Intermittent vomiting 03/15/2013  . Medication side effect 03/15/2013  . Abnormal LFTs 03/15/2013  . Type 2 diabetes mellitus, controlled (HCC) 07/12/2012  . Rectal pain 02/25/2012  . Transfusion history 02/25/2012  . Hepatic steatosis 06/15/2011  . Hypertension 06/07/2011  . PCOS (polycystic ovarian syndrome) 06/07/2011  . Hyperlipidemia 06/07/2011  . DUB (dysfunctional uterine bleeding) 06/07/2011  . Nausea 06/07/2011    Past Surgical History:  Procedure Laterality Date  . ablastion  09/2011  . blood trans  08/2011  . DILATION AND CURETTAGE OF UTERUS  09/2011  . NO PAST SURGERIES       OB History    Gravida  1   Para  1   Term  1   Preterm  0   AB  0   Living  1     SAB  0   TAB  0   Ectopic  0   Multiple  0   Live Births               Home Medications    Prior to Admission medications   Medication Sig Start Date End Date Taking? Authorizing Provider  Coenzyme Q10 (CO Q 10 PO) Take 1 tablet by mouth daily.  Yes [provider]  JANUMET XR 50-1000 MG TB24 TAKE TWO TABLETS BY MOUTH DAILY  Patient taking differently: Take 1 tablet by mouth daily.  09/10/19  Yes Panosh, Neta Mends, MD  lisinopril-hydrochlorothiazide (ZESTORETIC) 20-25 MG tablet TAKE ONE TABLET BY MOUTH ONE TIME DAILY  Patient taking differently: Take 1 tablet by mouth daily.  09/10/19  Yes Panosh, Neta Mends, MD  pravastatin (PRAVACHOL) 20 MG tablet Take 1 tablet (20 mg total) by mouth daily. May increase to 40 mg per day if tolerated Patient taking differently: Take 20 mg by mouth daily.  10/16/18  Yes Panosh, Neta Mends, MD  amoxicillin (AMOXIL) 500 MG capsule Take 1 capsule (500 mg total) by mouth 3 (three) times daily. Patient not taking: Reported on 09/22/2019 11/28/18   Terressa Koyanagi, DO  ondansetron (ZOFRAN) 4 MG tablet Take 1 tablet (4 mg total) by mouth every 6 (six) hours. 09/23/19   Derwood Kaplan, MD  Norethin-Eth Estrad-Fe Biphas (LO LOESTRIN FE PO) Take 1 tablet by mouth 2 (two) times daily.    02/25/12  [provider]  pioglitazone (ACTOS) 30 MG tablet Take 30 mg by mouth daily.    02/25/12  [provider]    Family History Family History  Problem Relation Age of Onset  . Hypertension Mother   . CAD Mother   . Diabetes Father   . CAD Father   . Diabetes Other        Grandparent  . Stroke Other        Grandparent  . Hyperlipidemia Unknown        parents  . Developmental delay Daughter   . Stroke Maternal Grandmother   . Hypertension Maternal Grandmother   . Diabetes Paternal Grandmother   . Kidney failure Paternal Grandmother     Social History Social History   Tobacco Use  . Smoking status: Never Smoker  . Smokeless tobacco: Never Used  Substance Use Topics  . Alcohol use: No  . Drug use: No     Allergies   Actos [pioglitazone], Pravastatin, and Other   Review of Systems Review of Systems  Constitutional: Positive for activity change.  Respiratory: Positive for shortness of breath.   Cardiovascular: Positive for chest pain and palpitations.  Gastrointestinal: Positive for diarrhea, nausea and vomiting.  Neurological: Negative for dizziness.  All other systems reviewed and are negative.    Physical Exam Updated Vital Signs BP 119/78   Pulse 68   Temp 98.2 F (36.8 C) (Oral)   Resp 12   Ht 5\' 2"  (1.575 m)   Wt 64.4 kg   SpO2 91%   BMI 25.97 kg/m   Physical Exam Vitals signs and nursing note reviewed.  Constitutional:      Appearance: She is well-developed.  HENT:     Head: Normocephalic and atraumatic.  Neck:     Musculoskeletal: Normal range of motion and neck supple.  Cardiovascular:     Rate and Rhythm: Normal rate.     Pulses: Normal pulses.  Pulmonary:     Effort: Pulmonary effort  is normal.  Abdominal:     General: Bowel sounds are normal.     Tenderness: There is no abdominal tenderness.  Skin:    General: Skin is warm and dry.  Neurological:     Mental Status: She is alert and oriented to person, place, and time.      ED Treatments / Results  Labs (all labs ordered are listed, but only abnormal results  are displayed) Labs Reviewed  COMPREHENSIVE METABOLIC PANEL - Abnormal; Notable for the following components:      Result Value   Potassium 2.8 (*)    Glucose, Bld 140 (*)    All other components within normal limits  CBC - Abnormal; Notable for the following components:   WBC 16.6 (*)    All other components within normal limits  URINALYSIS, ROUTINE W REFLEX MICROSCOPIC - Abnormal; Notable for the following components:   pH 8.5 (*)    Ketones, ur 15 (*)    Protein, ur >300 (*)    All other components within normal limits  LIPASE, BLOOD  URINALYSIS, MICROSCOPIC (REFLEX)  MAGNESIUM  I-STAT BETA HCG BLOOD, ED ([facility], [facility], [facility] ONLY)  TROPONIN I (HIGH SENSITIVITY)  TROPONIN I (HIGH SENSITIVITY)    EKG EKG Interpretation  Date/Time:  Saturday September 22 2019 23:45:00 EDT Ventricular Rate:  69 PR Interval:    QRS Duration: 106 QT Interval:  421 QTC Calculation: 451 R Axis:   164 Text Interpretation:  Sinus rhythm Probable left atrial enlargement S1,S2,S3 pattern Borderline repolarization abnormality No old tracing to compare Reconfirmed by Derwood KaplanNanavati, Kehinde Bowdish [phone]([id number]) on 09/23/2019 2:09:00 AM   EKG Interpretation  Date/Time:  Sunday September 23 2019 02:42:43 EDT Ventricular Rate:  67 PR Interval:    QRS Duration: 114 QT Interval:  460 QTC Calculation: 486 R Axis:   21 Text Interpretation:  Sinus rhythm Borderline intraventricular conduction delay Borderline T abnormalities, anterior leads Borderline prolonged QT interval No acute changes No significant change since last tracing Confirmed by Derwood KaplanNanavati, Adyn Serna [phone]([id number]) on 09/23/2019 3:10:47 AM        Radiology No results found.  Procedures Procedures (including critical care time)  Medications Ordered in ED Medications  ondansetron (ZOFRAN-ODT) disintegrating tablet 4 mg (has no administration in time range)  sodium chloride flush (NS) 0.9 % injection 3 mL (3 mLs Intravenous Given 09/23/19 0228)  lactated ringers bolus 1,000 mL (0 mLs Intravenous Stopped 09/23/19 0327)  potassium chloride SA (KLOR-CON) CR tablet 40 mEq (40 mEq Oral Given 09/23/19 0227)     Initial Impression / Assessment and Plan / ED Course  I have reviewed the triage vital signs and the nursing notes.  Pertinent labs & imaging results that were available during my care of the patient were reviewed by me and considered in my medical decision making (see chart for details).  Clinical Course as of Sep 22 601  Wynelle LinkSun Sep 23, 2019  0602 The patient appears reasonably screened and/or stabilized for discharge and I doubt any other medical condition or other [facility] requiring further screening, evaluation, or treatment in the ED at this time prior to discharge.  Results from the ER workup discussed with the patient face to face and all questions answered to the best of my ability. The patient is safe for discharge with strict return precautions.     [AN]    Clinical Course User Index [AN] Derwood KaplanNanavati, Benito Lemmerman, MD    HEAR Score: 604  53 year old comes in a chief complaint of nausea and vomiting.  She had sudden onset of nausea and vomiting with chest palpitations/pounding.  All of her symptoms have now resolved.  She states that she started having the symptoms 2 hours after eating some wild mushrooms that her friend had picked for her.  Her husband had the same mushrooms and he is asymptomatic.  She has family history of CAD at age 53 and she is diabetic and has hypertension and hyperlipidemia.  EKG has S1-S2-S3 pattern.  She has no PE risk factors.  She was on estrogen but that was several years ago.  Lower extremity exam does  not reveal any evidence of DVT.  Outside of PE, ACS considered in the differential diagnosis given her risk factors and history and very nonspecific symptomology.  If her husband was having similar symptoms we would have been less inclined to do cardiac evaluation.  Delta troponin will be ordered.  If the work-up in the ED is negative and patient passes oral challenge then she will be discharged with PCP follow-up.  With her having S1-S2-S3 pattern and palpitations she will likely benefit with echo and Holter monitoring.  Final Clinical Impressions(s) / ED Diagnoses   Final diagnoses:  Abnormal EKG  Nausea vomiting and diarrhea  Rapid palpitations    ED Discharge Orders         Ordered    ondansetron (ZOFRAN) 4 MG tablet  Every 6 hours     09/23/19 0417           Varney Biles, MD 09/23/19 6734    Varney Biles, MD 09/23/19 1937

## 2019-10-01 ENCOUNTER — Other Ambulatory Visit: Payer: Self-pay

## 2019-10-01 DIAGNOSIS — Z20822 Contact with and (suspected) exposure to covid-19: Secondary | ICD-10-CM

## 2019-10-02 LAB — NOVEL CORONAVIRUS, NAA: SARS-CoV-2, NAA: NOT DETECTED

## 2019-10-17 ENCOUNTER — Other Ambulatory Visit: Payer: Self-pay | Admitting: Internal Medicine

## 2019-11-02 ENCOUNTER — Other Ambulatory Visit: Payer: Self-pay | Admitting: Internal Medicine

## 2019-11-17 IMAGING — MG DIGITAL SCREENING BILATERAL MAMMOGRAM WITH TOMO AND CAD
8 series · 9 of 24 positions shown · non-contrast
Comparison: None.

CLINICAL DATA: Screening.

EXAM:
DIGITAL SCREENING BILATERAL MAMMOGRAM WITH TOMO AND CAD

[R CC synth-2D]
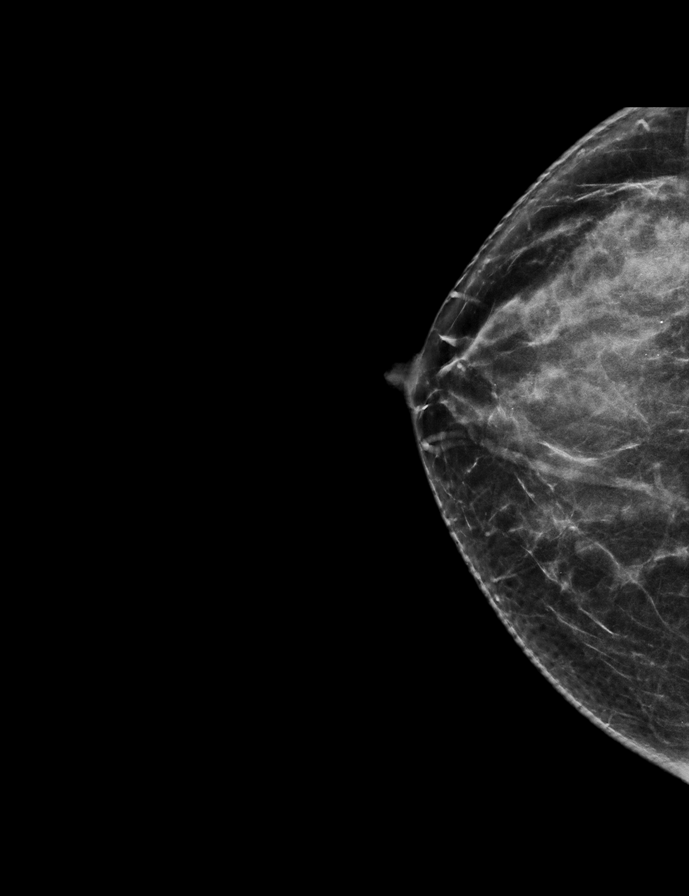

[R MLO synth-2D]
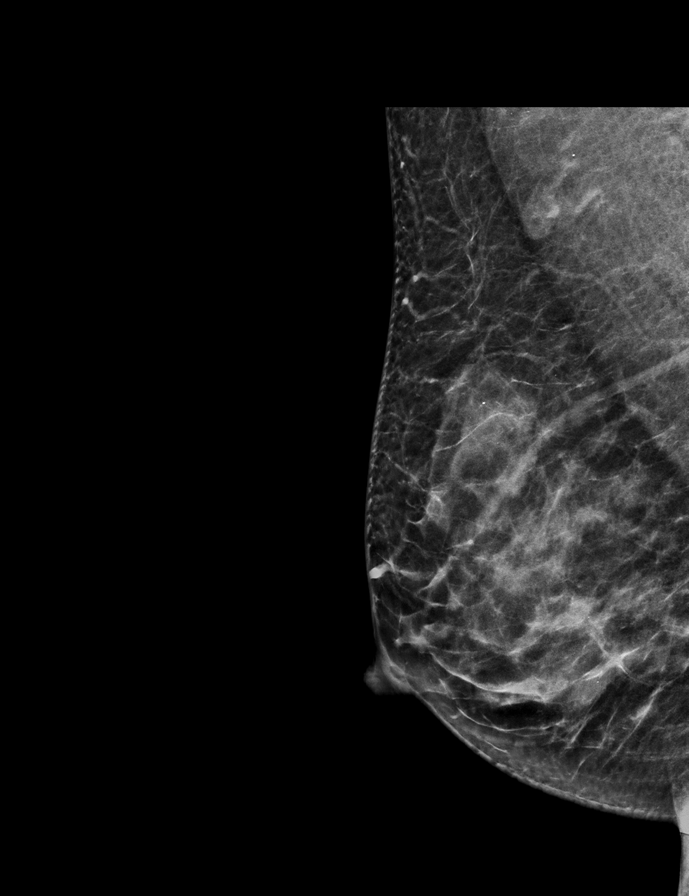

[L CC synth-2D]
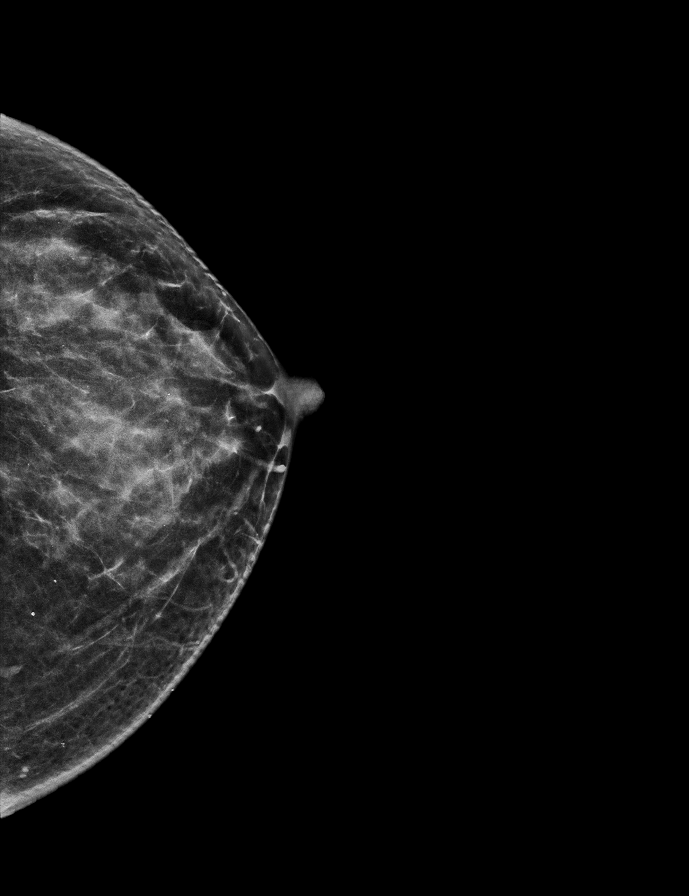

[L MLO synth-2D]
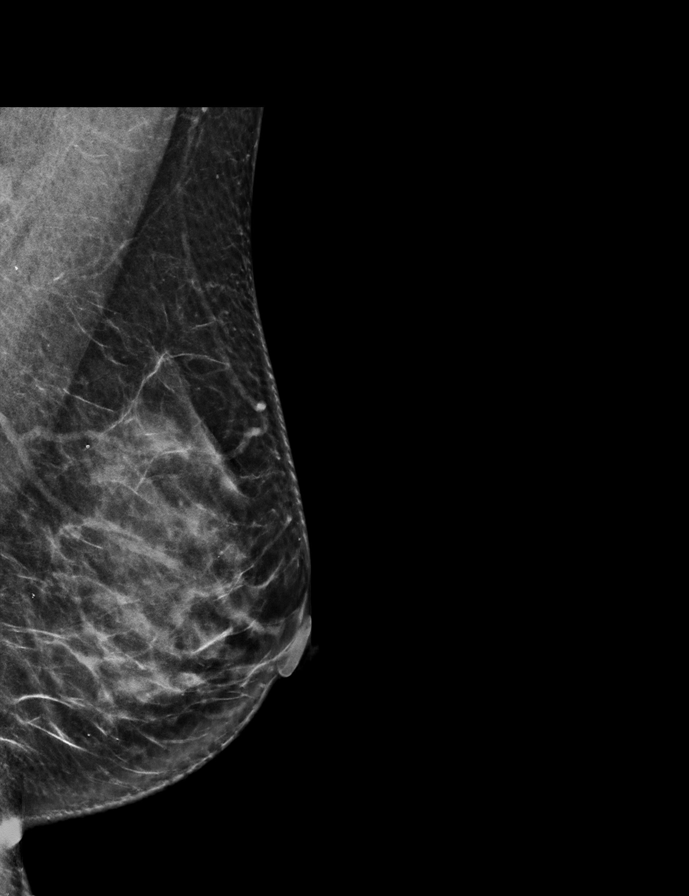

[L CC tomo · 2 of 53 frames shown]
[frame 18/53]
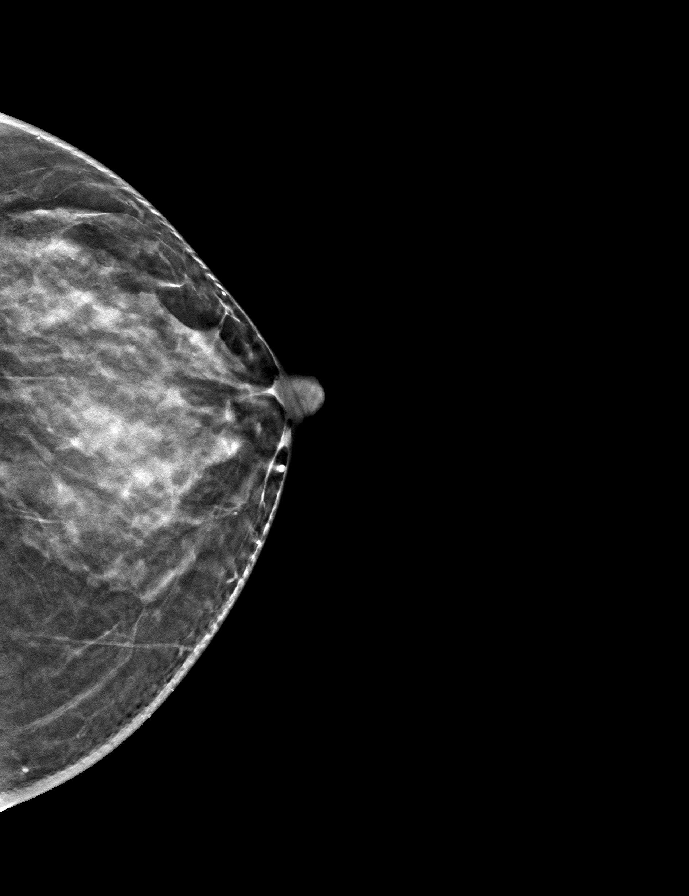
[frame 27/53]
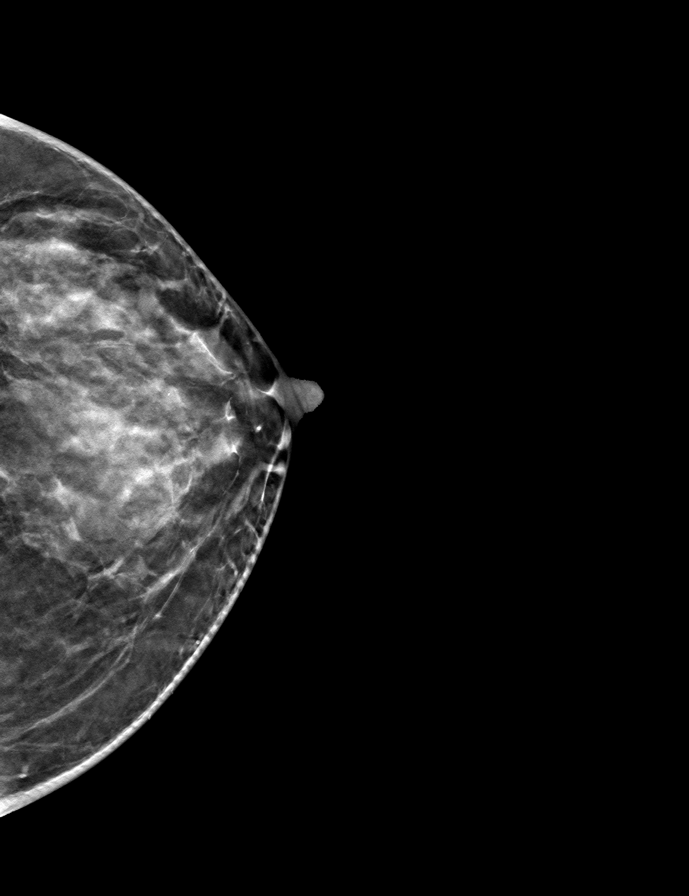

[R CC tomo · tomo slice 30/59.0]
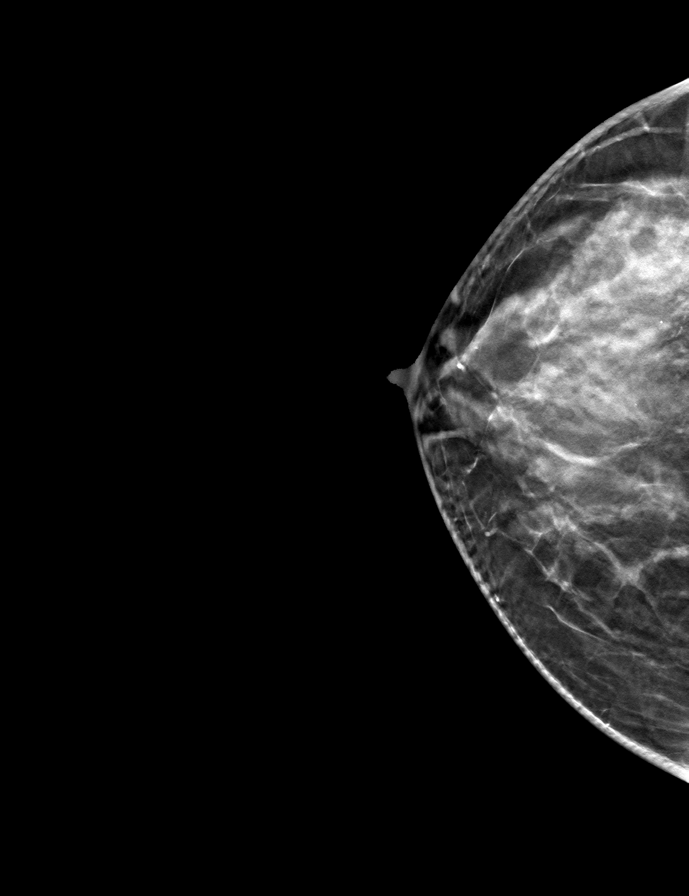

[L MLO tomo · tomo slice 34/67.0]
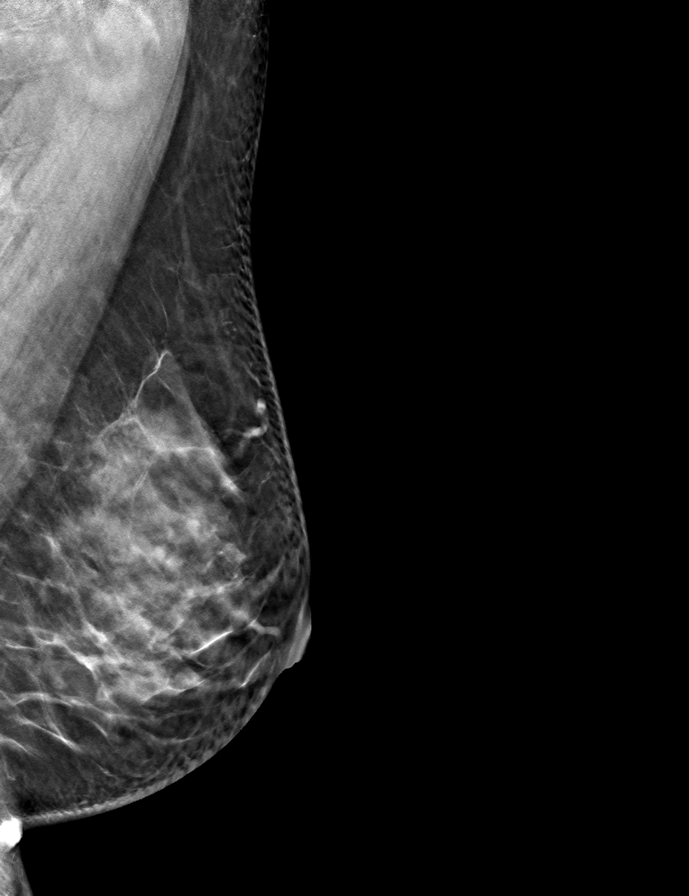

[R MLO tomo · tomo slice 33/64.0]
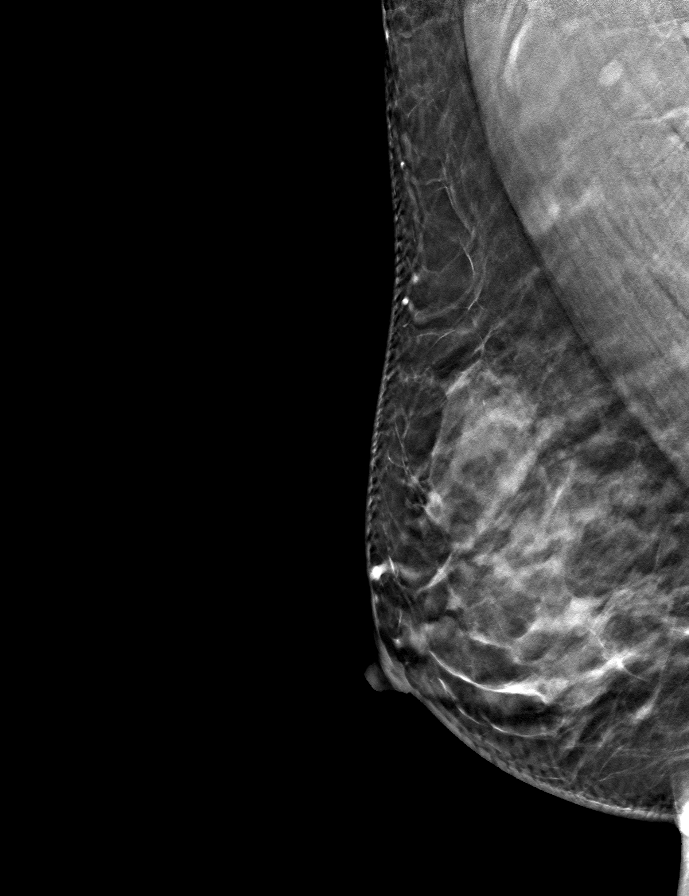

[9 of 24 positions shown; findings below may reference images not displayed]

ACR Breast Density Category c: The breast tissue is heterogeneously
dense, which may obscure small masses
FINDINGS: There are no findings suspicious for malignancy. Images were
processed with CAD.
IMPRESSION: No mammographic evidence of malignancy. A result letter of this
screening mammogram will be mailed directly to the patient.

RECOMMENDATION:
Screening mammogram in one year. (Code:EM-2-IHY)

BI-RADS CATEGORY  1: Negative.

## 2019-11-23 ENCOUNTER — Encounter: Payer: Self-pay | Admitting: Internal Medicine

## 2019-11-23 ENCOUNTER — Ambulatory Visit (INDEPENDENT_AMBULATORY_CARE_PROVIDER_SITE_OTHER): Payer: BC Managed Care – PPO | Admitting: Internal Medicine

## 2019-11-23 ENCOUNTER — Other Ambulatory Visit: Payer: Self-pay

## 2019-11-23 VITALS — BP 116/62 | HR 65 | Temp 97.6°F | Ht 62.0 in | Wt 143.2 lb

## 2019-11-23 DIAGNOSIS — E785 Hyperlipidemia, unspecified: Secondary | ICD-10-CM | POA: Diagnosis not present

## 2019-11-23 DIAGNOSIS — Z0000 Encounter for general adult medical examination without abnormal findings: Secondary | ICD-10-CM | POA: Diagnosis not present

## 2019-11-23 DIAGNOSIS — E282 Polycystic ovarian syndrome: Secondary | ICD-10-CM

## 2019-11-23 DIAGNOSIS — E119 Type 2 diabetes mellitus without complications: Secondary | ICD-10-CM

## 2019-11-23 DIAGNOSIS — K760 Fatty (change of) liver, not elsewhere classified: Secondary | ICD-10-CM

## 2019-11-23 DIAGNOSIS — Z79899 Other long term (current) drug therapy: Secondary | ICD-10-CM

## 2019-11-23 LAB — HEPATIC FUNCTION PANEL
ALT: 15 U/L (ref 0–35)
AST: 15 U/L (ref 0–37)
Albumin: 4.4 g/dL (ref 3.5–5.2)
Alkaline Phosphatase: 54 U/L (ref 39–117)
Bilirubin, Direct: 0.1 mg/dL (ref 0.0–0.3)
Total Bilirubin: 0.9 mg/dL (ref 0.2–1.2)
Total Protein: 7 g/dL (ref 6.0–8.3)

## 2019-11-23 LAB — CBC WITH DIFFERENTIAL/PLATELET
Basophils Absolute: 0 10*3/uL (ref 0.0–0.1)
Basophils Relative: 0.7 % (ref 0.0–3.0)
Eosinophils Absolute: 0.2 10*3/uL (ref 0.0–0.7)
Eosinophils Relative: 2.9 % (ref 0.0–5.0)
HCT: 40.9 % (ref 36.0–46.0)
Hemoglobin: 13.6 g/dL (ref 12.0–15.0)
Lymphocytes Relative: 40 % (ref 12.0–46.0)
Lymphs Abs: 2.3 10*3/uL (ref 0.7–4.0)
MCHC: 33.2 g/dL (ref 30.0–36.0)
MCV: 92.4 fl (ref 78.0–100.0)
Monocytes Absolute: 0.3 10*3/uL (ref 0.1–1.0)
Monocytes Relative: 5 % (ref 3.0–12.0)
Neutro Abs: 3 10*3/uL (ref 1.4–7.7)
Neutrophils Relative %: 51.4 % (ref 43.0–77.0)
Platelets: 233 10*3/uL (ref 150.0–400.0)
RBC: 4.42 Mil/uL (ref 3.87–5.11)
RDW: 13.6 % (ref 11.5–15.5)
WBC: 5.8 10*3/uL (ref 4.0–10.5)

## 2019-11-23 LAB — HEMOGLOBIN A1C: Hgb A1c MFr Bld: 6.2 % (ref 4.6–6.5)

## 2019-11-23 LAB — LIPID PANEL
Cholesterol: 220 mg/dL — ABNORMAL HIGH (ref 0–200)
HDL: 72.1 mg/dL (ref >39.00)
LDL Cholesterol: 125 mg/dL — ABNORMAL HIGH (ref 0–99)
NonHDL: 147.79
Total CHOL/HDL Ratio: 3
Triglycerides: 115 mg/dL (ref 0.0–149.0)
VLDL: 23 mg/dL (ref 0.0–40.0)

## 2019-11-23 LAB — BASIC METABOLIC PANEL
BUN: 12 mg/dL (ref 6–23)
CO2: 29 mEq/L (ref 19–32)
Calcium: 9.7 mg/dL (ref 8.4–10.5)
Chloride: 102 mEq/L (ref 96–112)
Creatinine, Ser: 0.73 mg/dL (ref 0.40–1.20)
GFR: 83.14 mL/min (ref >60.00)
Glucose, Bld: 128 mg/dL — ABNORMAL HIGH (ref 70–99)
Potassium: 4.4 mEq/L (ref 3.5–5.1)
Sodium: 140 mEq/L (ref 135–145)

## 2019-11-23 LAB — TSH: TSH: 2.19 u[IU]/mL (ref 0.35–4.50)

## 2019-11-23 LAB — MICROALBUMIN / CREATININE URINE RATIO
Creatinine,U: 88 mg/dL
Microalb Creat Ratio: 40.2 mg/g — ABNORMAL HIGH (ref 0.0–30.0)
Microalb, Ur: 35.4 mg/dL — ABNORMAL HIGH (ref 0.0–1.9)

## 2019-11-23 NOTE — Progress Notes (Signed)
This visit occurred during the SARS-CoV-2 public health emergency.  Safety protocols were in place, including screening questions prior to the visit, additional usage of staff PPE, and extensive cleaning of exam room while observing appropriate contact time as indicated for disinfecting solutions.    Chief Complaint  Patient presents with  . Annual Exam  . Medication Management  . Diabetes    HPI: Patient  Amber [name]  53 y.o. comes in today for Preventive Health Care visit  And fu med eval.   DM PCOS :  Bg not checked but feels good  janumet   Had acute ed visit for gi sx and low potassuium fle to be acute illness , felt from [place] but doing fine now.  No vision  Issues last check [place]   A year ago .  No neuropathy sx   HLD: meds prava  BP controlled lis hctz   Last  Period    And had  Ablation.    8 years.  Doing well declined pap but last pap 2017 neg hpv and low risk   Exercising more since covid since not working   Health Maintenance  Topic Date Due  . PNEUMOCOCCAL POLYSACCHARIDE VACCINE AGE 26-64 HIGH RISK  02/08/1968  . OPHTHALMOLOGY EXAM  02/08/1976  . HIV Screening  02/07/1981  . COLONOSCOPY  02/08/2016  . HEMOGLOBIN A1C  04/17/2019  . FOOT EXAM  05/13/2019  . INFLUENZA VACCINE  07/21/2019  . PAP SMEAR-Modifier  11/23/2019 (Originally 03/09/2019)  . MAMMOGRAM  03/07/2020  . TETANUS/TDAP  06/05/2023   Health Maintenance Review LIFESTYLE:  Exercise:   3-5 x per week  Tobacco/ETS:n Alcohol: n Sugar beverages: no Sleep: 8-9  Drug use: no HH of 3  Work:at home not since covid   ROS:  GEN/ HEENT: No fever, significant weight changes sweats headaches vision problems hearing changes, CV/ PULM; No chest pain shortness of breath cough, syncope,edema  change in exercise tolerance. GI /GU: No adominal pain, vomiting, change in bowel habits. No blood in the stool. No significant GU symptoms. SKIN/HEME: ,no acute skin rashes suspicious lesions or bleeding. No  lymphadenopathy, nodules, masses.  NEURO/ PSYCH:  No neurologic signs such as weakness numbness. No depression anxiety. IMM/ Allergy: No unusual infections.  Allergy .   REST of 12 system review negative except as per HPI   Past Medical History:  Diagnosis Date  . Anemia   . Diabetes mellitus   . Hyperlipidemia   . Hypertension     Past Surgical History:  Procedure Laterality Date  . ablastion  09/2011  . blood trans  08/2011  . DILATION AND CURETTAGE OF UTERUS  09/2011  . NO PAST SURGERIES      Family History  Problem Relation Age of Onset  . Hypertension Mother   . CAD Mother   . Diabetes Father   . CAD Father   . Diabetes Other        Grandparent  . Stroke Other        Grandparent  . Hyperlipidemia Unknown        parents  . Developmental delay Daughter   . Stroke Maternal Grandmother   . Hypertension Maternal Grandmother   . Diabetes Paternal Grandmother   . Kidney failure Paternal Grandmother     Social History   Socioeconomic History  . Marital status: Married    Spouse name: Not on file  . Number of children: 1  . Years of education: BS  . Highest education  level: Not on file  Occupational History  . Occupation: Acupuncturist  Social Needs  . Financial resource strain: Not on file  . Food insecurity    Worry: Not on file    Inability: Not on file  . Transportation needs    Medical: Not on file    Non-medical: Not on file  Tobacco Use  . Smoking status: Never Smoker  . Smokeless tobacco: Never Used  Substance and Sexual Activity  . Alcohol use: No  . Drug use: No  . Sexual activity: Yes    Birth control/protection: Surgical  Lifestyle  . Physical activity    Days per week: Not on file    Minutes per session: Not on file  . Stress: Not on file  Relationships  . Social Musician on phone: Not on file    Gets together: Not on file    Attends religious service: Not on file    Active member of club or organization: Not on file     Attends meetings of clubs or organizations: Not on file    Relationship status: Not on file  Other Topics Concern  . 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     Outpatient Medications Prior to Visit  Medication Sig Dispense Refill  . Coenzyme Q10 (CO Q 10 PO) Take 1 tablet by mouth daily.    Marland Kitchen JANUMET XR 50-1000 MG TB24 TAKE TWO TABLETS BY MOUTH DAILY  (Patient taking differently: Take 1 tablet by mouth daily. ) 60 tablet 0  . lisinopril-hydrochlorothiazide (ZESTORETIC) 20-25 MG tablet TAKE ONE TABLET BY MOUTH ONE TIME DAILY  90 tablet 0  . ondansetron (ZOFRAN) 4 MG tablet Take 1 tablet (4 mg total) by mouth every 6 (six) hours. 12 tablet 0  . pravastatin (PRAVACHOL) 20 MG tablet Take 1 tablet (20 mg total) by mouth daily. May increase to 40 mg per day if tolerated (Patient taking differently: Take 20 mg by mouth daily. ) 30 tablet 12  . hydrochlorothiazide (HYDRODIURIL) 25 MG tablet TAKE ONE TABLET BY MOUTH ONE TIME DAILY  30 tablet 0  . lisinopril (ZESTRIL) 20 MG tablet TAKE ONE TABLET BY MOUTH ONE TIME DAILY  *need a physical for further refills* 30 tablet 0  . amoxicillin (AMOXIL) 500 MG capsule Take 1 capsule (500 mg total) by mouth 3 (three) times daily. (Patient not taking: Reported on 09/22/2019) 30 capsule 0   No facility-administered medications prior to visit.      EXAM:  BP 116/62 (BP Location: Right Arm, Patient Position: Sitting, Cuff Size: Normal)   Pulse 65   Temp 97.6 F (36.4 C) (Temporal)   Ht  (1.575 m)   Wt 143 lb 3.2 oz (65 kg)   SpO2 94%   BMI 26.19 kg/m   Body mass index is 26.19 kg/m. Wt Readings from Last 3 Encounters:  11/23/19 143 lb 3.2 oz (65 kg)  09/22/19 142 lb (64.4 kg)  11/28/18 149 lb 9.6 oz (67.9 kg)    Physical Exam:  Vital signs reviewed WJX:BJYN is a well-developed well-nourished alert cooperative    who appearsr stated age in no acute distress.  HEENT: normocephalic atraumatic ,  Eyes: PERRL EOM's full, conjunctiva clear, Nares: paten,t no deformity discharge or tenderness., Ears: no deformity EAC's clear TMs with normal landmarks. Mouth: mask NECK: supple without masses, thyromegaly or bruits. CHEST/PULM:  Clear to auscultation and percussion breath sounds equal no wheeze , rales or rhonchi. No chest wall deformities or tenderness. Breast: normal by inspection . No dimpling, discharge, masses, tenderness or discharge . CV: PMI is nondisplaced, S1 S2 no gallops, murmurs, rubs. Peripheral pulses are full without delay.No JVD .  ABDOMEN: Bowel sounds normal nontender  No guard or rebound, no hepato splenomegal no CVA tenderness.   Extremtities:  No clubbing cyanosis or edema, no acute joint swelling or redness no focal atrophy NEURO:  Oriented x3, cranial nerves 3-12 appear to be intact, no obvious focal weakness,gait within normal limits no abnormal reflexes or asymmetrical SKIN: No acute rashes normal turgor, color, no bruising or petechiae.  Scaly patch left shin area  PSYCH: Oriented, good eye contact, no obvious depression anxiety, cognition and judgment appear normal. LN: no cervical axillary inguinal adenopathy Diabetic Foot Exam - Simple   Simple Foot Form Diabetic Foot exam was performed with the following findings: Yes 11/23/2019  9:24 AM  Visual Inspection No deformities, no ulcerations, no other skin breakdown bilaterally: Yes Sensation Testing Intact to touch and monofilament testing bilaterally: Yes Pulse Check Posterior Tibialis and Dorsalis pulse intact bilaterally: Yes Comments     Lab Results  Component Value Date   WBC 5.8 11/23/2019   HGB 13.6 11/23/2019   HCT 40.9 11/23/2019   PLT 233.0 11/23/2019   GLUCOSE 128 (H) 11/23/2019   CHOL 220 (H) 11/23/2019   TRIG 115.0 11/23/2019    HDL 72.10 11/23/2019   LDLDIRECT 118.0 10/20/2016   LDLCALC 125 (H) 11/23/2019   ALT 15 11/23/2019   AST 15 11/23/2019   NA 140 11/23/2019   K 4.4 11/23/2019   CL 102 11/23/2019   CREATININE 0.73 11/23/2019   BUN 12 11/23/2019   CO2 29 11/23/2019   TSH 2.19 11/23/2019   INR 1.03 09/12/2011   HGBA1C 6.2 11/23/2019   MICROALBUR 35.4 (H) 11/23/2019    BP Readings from Last 3 Encounters:  11/23/19 116/62  09/23/19 121/71  11/28/18 120/80     ASSESSMENT AND PLAN:  Discussed the following assessment and plan:    ICD-10-CM   1. Visit for preventive health examination  Z00.00 Basic metabolic panel    CBC with Differential    Hemoglobin A1c    Hepatic function panel    Lipid panel    TSH    Microalbumin / creatinine urine ratio  2. Medication management  Z79.899 Basic metabolic panel    CBC with Differential    Hemoglobin A1c    Hepatic function panel    Lipid panel    TSH    Microalbumin / creatinine urine ratio  3. Hyperlipidemia, unspecified hyperlipidemia type  E78.5 Basic metabolic panel    CBC with Differential    Hemoglobin A1c    Hepatic function panel    Lipid panel    TSH    Microalbumin / creatinine urine ratio  4. PCOS (polycystic ovarian syndrome)  E28.2 Basic metabolic panel    CBC with Differential    Hemoglobin A1c    Hepatic function panel    Lipid panel    TSH    Microalbumin / creatinine urine ratio  5. Hepatic steatosis  K76.0 Basic metabolic panel    CBC with Differential    Hemoglobin A1c  Hepatic function panel    Lipid panel    TSH    Microalbumin / creatinine urine ratio  6. Diabetes mellitus without complication (HCC)  E11.9 Basic metabolic panel    CBC with Differential    Hemoglobin A1c    Hepatic function panel    Lipid panel    TSH    Microalbumin / creatinine urine ratio   Decline pneumovax   Disc shingrix  ( had varicella   immuniz no hx of same  ? Pap  Due by 2022.   Eye exam  Get update  Continue lifestyle  intervention healthy eating and exercise .  Plan lab pre or at 6 mos visit  Patient Care Team: , Neta MendsWanda K, MD as PCP - General (Internal Medicine) Patient Instructions  Titus DubinGlad  You are doing well.   Will notify you  of labs when available.  ROV in 6 months depending  Get an eye exam in next year .  Get your flu vaccine.    Health Maintenance, Female Adopting a healthy lifestyle and getting preventive care are important in promoting health and wellness. Ask your health care provider about:  The right schedule for you to have regular tests and exams.  Things you can do on your own to prevent diseases and keep yourself healthy. What should I know about diet, weight, and exercise? Eat a healthy diet   Eat a diet that includes plenty of vegetables, fruits, low-fat dairy products, and lean protein.  Do not eat a lot of foods that are high in solid fats, added sugars, or sodium. Maintain a healthy weight Body mass index (BMI) is used to identify weight problems. It estimates body fat based on height and weight. Your health care provider can help determine your BMI and help you achieve or maintain a healthy weight. Get regular exercise Get regular exercise. This is one of the most important things you can do for your health. Most adults should:  Exercise for at least 150 minutes each week. The exercise should increase your heart rate and make you sweat (moderate-intensity exercise).  Do strengthening exercises at least twice a week. This is in addition to the moderate-intensity exercise.  Spend less time sitting. Even light physical activity can be beneficial. Watch cholesterol and blood lipids Have your blood tested for lipids and cholesterol at 53 years of age, then have this test every 5 years. Have your cholesterol levels checked more often if:  Your lipid or cholesterol levels are high.  You are older than 53 years of age.  You are at high risk for heart disease. What  should I know about cancer screening? Depending on your health history and family history, you may need to have cancer screening at various ages. This may include screening for:  Breast cancer.  Cervical cancer.  Colorectal cancer.  Skin cancer.  Lung cancer. What should I know about heart disease, diabetes, and high blood pressure? Blood pressure and heart disease  High blood pressure causes heart disease and increases the risk of stroke. This is more likely to develop in people who have high blood pressure readings, are of African descent, or are overweight.  Have your blood pressure checked: ? Every 3-5 years if you are 8118-53 years of age. ? Every year if you are 53 years old or older. Diabetes Have regular diabetes screenings. This checks your fasting blood sugar level. Have the screening done:  Once every three years after age 53 if you are at  a normal weight and have a low risk for diabetes.  More often and at a younger age if you are overweight or have a high risk for diabetes. What should I know about preventing infection? Hepatitis B If you have a higher risk for hepatitis B, you should be screened for this virus. Talk with your health care provider to find out if you are at risk for hepatitis B infection. Hepatitis C Testing is recommended for:  Everyone born from [birth year] through 1965.  Anyone with known risk factors for hepatitis C. Sexually transmitted infections (STIs)  Get screened for STIs, including gonorrhea and chlamydia, if: ? You are sexually active and are younger than 53 years of age. ? You are older than 53 years of age and your health care provider tells you that you are at risk for this type of infection. ? Your sexual activity has changed since you were last screened, and you are at increased risk for chlamydia or gonorrhea. Ask your health care provider if you are at risk.  Ask your health care provider about whether you are at high risk for HIV. Your  health care provider may recommend a prescription medicine to help prevent HIV infection. If you choose to take medicine to prevent HIV, you should first get tested for HIV. You should then be tested every 3 months for as long as you are taking the medicine. Pregnancy  If you are about to stop having your period (premenopausal) and you may become pregnant, seek counseling before you get pregnant.  Take 400 to 800 micrograms (mcg) of folic acid every day if you become pregnant.  Ask for birth control (contraception) if you want to prevent pregnancy. Osteoporosis and menopause Osteoporosis is a disease in which the bones lose minerals and strength with aging. This can result in bone fractures. If you are 44 years old or older, or if you are at risk for osteoporosis and fractures, ask your health care provider if you should:  Be screened for bone loss.  Take a calcium or vitamin D supplement to lower your risk of fractures.  Be given hormone replacement therapy (HRT) to treat symptoms of menopause. Follow these instructions at home: Lifestyle  Do not use any products that contain nicotine or tobacco, such as cigarettes, e-cigarettes, and chewing tobacco. If you need help quitting, ask your health care provider.  Do not use street drugs.  Do not share needles.  Ask your health care provider for help if you need support or information about quitting drugs. Alcohol use  Do not drink alcohol if: ? Your health care provider tells you not to drink. ? You are pregnant, may be pregnant, or are planning to become pregnant.  If you drink alcohol: ? Limit how much you use to 0-1 drink a day. ? Limit intake if you are breastfeeding.  Be aware of how much alcohol is in your drink. In the [place] one drink equals one 12 oz bottle of beer (355 mL), one 5 oz glass of wine (148 mL), or one 1 oz glass of hard liquor (44 mL). General instructions  Schedule regular health, dental, and eye exams.   Stay current with your vaccines.  Tell your health care provider if: ? You often feel depressed. ? You have ever been abused or do not feel safe at home. Summary  Adopting a healthy lifestyle and getting preventive care are important in promoting health and wellness.  Follow your health care provider's instructions about healthy  diet, exercising, and getting tested or screened for diseases.  Follow your health care provider's instructions on monitoring your cholesterol and blood pressure. This information is not intended to replace advice given to you by your health care provider. Make sure you discuss any questions you have with your health care provider. Document Released: 06/21/2011 Document Revised: 11/29/2018 Document Reviewed: 11/29/2018 Elsevier Patient Education  2020 [place]  M.D.

## 2019-11-23 NOTE — Patient Instructions (Signed)
Glad  You are doing well.   Will notify you  of labs when available.  ROV in 6 months depending  Get an eye exam in next year .  Get your flu vaccine.    Health Maintenance, Female Adopting a healthy lifestyle and getting preventive care are important in promoting health and wellness. Ask your health care provider about:  The right schedule for you to have regular tests and exams.  Things you can do on your own to prevent diseases and keep yourself healthy. What should I know about diet, weight, and exercise? Eat a healthy diet   Eat a diet that includes plenty of vegetables, fruits, low-fat dairy products, and lean protein.  Do not eat a lot of foods that are high in solid fats, added sugars, or sodium. Maintain a healthy weight Body mass index (BMI) is used to identify weight problems. It estimates body fat based on height and weight. Your health care provider can help determine your BMI and help you achieve or maintain a healthy weight. Get regular exercise Get regular exercise. This is one of the most important things you can do for your health. Most adults should:  Exercise for at least 150 minutes each week. The exercise should increase your heart rate and make you sweat (moderate-intensity exercise).  Do strengthening exercises at least twice a week. This is in addition to the moderate-intensity exercise.  Spend less time sitting. Even light physical activity can be beneficial. Watch cholesterol and blood lipids Have your blood tested for lipids and cholesterol at 53 years of age, then have this test every 5 years. Have your cholesterol levels checked more often if:  Your lipid or cholesterol levels are high.  You are older than 53 years of age.  You are at high risk for heart disease. What should I know about cancer screening? Depending on your health history and family history, you may need to have cancer screening at various ages. This may include screening for:   Breast cancer.  Cervical cancer.  Colorectal cancer.  Skin cancer.  Lung cancer. What should I know about heart disease, diabetes, and high blood pressure? Blood pressure and heart disease  High blood pressure causes heart disease and increases the risk of stroke. This is more likely to develop in people who have high blood pressure readings, are of African descent, or are overweight.  Have your blood pressure checked: ? Every 3-5 years if you are 27-19 years of age. ? Every year if you are 65 years old or older. Diabetes Have regular diabetes screenings. This checks your fasting blood sugar level. Have the screening done:  Once every three years after age 3 if you are at a normal weight and have a low risk for diabetes.  More often and at a younger age if you are overweight or have a high risk for diabetes. What should I know about preventing infection? Hepatitis B If you have a higher risk for hepatitis B, you should be screened for this virus. Talk with your health care provider to find out if you are at risk for hepatitis B infection. Hepatitis C Testing is recommended for:  Everyone born from [birth year] through 1965.  Anyone with known risk factors for hepatitis C. Sexually transmitted infections (STIs)  Get screened for STIs, including gonorrhea and chlamydia, if: ? You are sexually active and are younger than 53 years of age. ? You are older than 53 years of age and your health care provider  tells you that you are at risk for this type of infection. ? Your sexual activity has changed since you were last screened, and you are at increased risk for chlamydia or gonorrhea. Ask your health care provider if you are at risk.  Ask your health care provider about whether you are at high risk for HIV. Your health care provider may recommend a prescription medicine to help prevent HIV infection. If you choose to take medicine to prevent HIV, you should first get tested for HIV. You  should then be tested every 3 months for as long as you are taking the medicine. Pregnancy  If you are about to stop having your period (premenopausal) and you may become pregnant, seek counseling before you get pregnant.  Take 400 to 800 micrograms (mcg) of folic acid every day if you become pregnant.  Ask for birth control (contraception) if you want to prevent pregnancy. Osteoporosis and menopause Osteoporosis is a disease in which the bones lose minerals and strength with aging. This can result in bone fractures. If you are 72 years old or older, or if you are at risk for osteoporosis and fractures, ask your health care provider if you should:  Be screened for bone loss.  Take a calcium or vitamin D supplement to lower your risk of fractures.  Be given hormone replacement therapy (HRT) to treat symptoms of menopause. Follow these instructions at home: Lifestyle  Do not use any products that contain nicotine or tobacco, such as cigarettes, e-cigarettes, and chewing tobacco. If you need help quitting, ask your health care provider.  Do not use street drugs.  Do not share needles.  Ask your health care provider for help if you need support or information about quitting drugs. Alcohol use  Do not drink alcohol if: ? Your health care provider tells you not to drink. ? You are pregnant, may be pregnant, or are planning to become pregnant.  If you drink alcohol: ? Limit how much you use to 0-1 drink a day. ? Limit intake if you are breastfeeding.  Be aware of how much alcohol is in your drink. In the [place] one drink equals one 12 oz bottle of beer (355 mL), one 5 oz glass of wine (148 mL), or one 1 oz glass of hard liquor (44 mL). General instructions  Schedule regular health, dental, and eye exams.  Stay current with your vaccines.  Tell your health care provider if: ? You often feel depressed. ? You have ever been abused or do not feel safe at home. Summary  Adopting a  healthy lifestyle and getting preventive care are important in promoting health and wellness.  Follow your health care provider's instructions about healthy diet, exercising, and getting tested or screened for diseases.  Follow your health care provider's instructions on monitoring your cholesterol and blood pressure. This information is not intended to replace advice given to you by your health care provider. Make sure you discuss any questions you have with your health care provider. Document Released: 06/21/2011 Document Revised: 11/29/2018 Document Reviewed: 11/29/2018 Elsevier Patient Education  2020 [employer].

## 2019-11-26 ENCOUNTER — Other Ambulatory Visit: Payer: Self-pay

## 2019-11-26 DIAGNOSIS — E785 Hyperlipidemia, unspecified: Secondary | ICD-10-CM

## 2019-11-26 DIAGNOSIS — E119 Type 2 diabetes mellitus without complications: Secondary | ICD-10-CM

## 2019-11-30 ENCOUNTER — Other Ambulatory Visit: Payer: Self-pay | Admitting: Internal Medicine

## 2019-12-15 ENCOUNTER — Other Ambulatory Visit: Payer: Self-pay | Admitting: Internal Medicine

## 2020-01-12 ENCOUNTER — Other Ambulatory Visit: Payer: Self-pay | Admitting: Internal Medicine

## 2020-02-11 ENCOUNTER — Other Ambulatory Visit: Payer: Self-pay | Admitting: Internal Medicine

## 2020-03-17 ENCOUNTER — Other Ambulatory Visit: Payer: Self-pay | Admitting: Internal Medicine

## 2020-06-02 ENCOUNTER — Other Ambulatory Visit: Payer: Self-pay | Admitting: Internal Medicine

## 2020-07-10 ENCOUNTER — Other Ambulatory Visit: Payer: Self-pay | Admitting: Internal Medicine

## 2020-08-22 ENCOUNTER — Telehealth: Payer: Self-pay | Admitting: Internal Medicine

## 2020-08-22 ENCOUNTER — Other Ambulatory Visit: Payer: Self-pay | Admitting: Internal Medicine

## 2020-08-22 NOTE — Telephone Encounter (Signed)
pt needs a refill on  pravastatin (PRAVACHOL) 20 MG tablet [id number] -  [place], [place] - [address]  Phone:  [phone] Fax:  [phone]  pt contact  Number [phone]

## 2020-08-22 NOTE — Telephone Encounter (Signed)
This prescription has already been sent to the pharmacy requested as we received a refill request from her pharmacy.

## 2020-09-08 ENCOUNTER — Other Ambulatory Visit: Payer: Self-pay | Admitting: Internal Medicine

## 2020-09-12 ENCOUNTER — Other Ambulatory Visit: Payer: Self-pay | Admitting: Internal Medicine

## 2020-09-15 ENCOUNTER — Other Ambulatory Visit: Payer: Self-pay | Admitting: Internal Medicine

## 2020-10-07 ENCOUNTER — Encounter: Payer: Self-pay | Admitting: Internal Medicine

## 2020-10-22 ENCOUNTER — Other Ambulatory Visit: Payer: Self-pay | Admitting: Internal Medicine

## 2020-11-04 ENCOUNTER — Other Ambulatory Visit: Payer: Self-pay | Admitting: Internal Medicine

## 2020-11-11 ENCOUNTER — Other Ambulatory Visit: Payer: Self-pay | Admitting: Internal Medicine

## 2020-12-03 ENCOUNTER — Other Ambulatory Visit: Payer: Self-pay | Admitting: Internal Medicine

## 2020-12-08 NOTE — Progress Notes (Signed)
Chief Complaint  Patient presents with  . Annual Exam    CPE/labs.  Fasting today.  C/o having worse digestion in last year.  Declines flu shot today.      HPI: Patient  Amber [name]  54 y.o. comes in today for Preventive Health Care visit  And Chronic disease management  GI:  2 years ago  On going issues of post prandial pain bloating and now worse  Bloated and tender and  Passing gas.  Hart to digest  . No blood    Was given rx for h pylori but didn't go throught w  Endoscopy colon   And wasn't able to get the whole  abtibiotic rx  But at that time not many sx .  And back pain and   No blood some alt const diarreh and constipation  Has last eating    2 pm yesterday . Eye doc  Go   Health Maintenance  Topic Date Due  . PNEUMOCOCCAL POLYSACCHARIDE VACCINE AGE 52-64 HIGH RISK  Never done  . OPHTHALMOLOGY EXAM  Never done  . HIV Screening  Never done  . COLONOSCOPY  Never done  . PAP SMEAR-Modifier  03/09/2019  . HEMOGLOBIN A1C  05/23/2020  . INFLUENZA VACCINE  07/20/2020  . FOOT EXAM  12/09/2021  . MAMMOGRAM  10/07/2022  . TETANUS/TDAP  06/05/2023  . COVID-19 Vaccine  Completed  . Hepatitis C Screening  Completed   Health Maintenance Review LIFESTYLE:  Exercise:   Good hike    . Tobacco/ETS: no Alcohol:  no Sugar beverages:  no Sleep:  8   Drug use: no HH of  3 Work: 20 per week     ROS:  GEN/ HEENT: No fever, significant weight changes sweats headaches vision problems hearing changes, CV/ PULM; No chest pain shortness of breath cough, syncope,edema  change in exercise tolerance. GI /GU: No. No blood in the stool. No significant GU symptoms. SKIN/HEME: ,no acute skin rashes suspicious lesions or bleeding. No lymphadenopathy, nodules, masses.  NEURO/ PSYCH:  No neurologic signs such as weakness numbness. No depression anxiety. IMM/ Allergy: No unusual infections.  Allergy .   REST of 12 system review negative except as per HPI   Past Medical History:  Diagnosis Date  .  Anemia   . Diabetes mellitus   . Hyperlipidemia   . Hypertension     Past Surgical History:  Procedure Laterality Date  . ablastion  09/2011  . blood trans  08/2011  . DILATION AND CURETTAGE OF UTERUS  09/2011  . NO PAST SURGERIES      Family History  Problem Relation Age of Onset  . Hypertension Mother   . CAD Mother   . Diabetes Father   . CAD Father   . Diabetes Other        Grandparent  . Stroke Other        Grandparent  . Hyperlipidemia Other        parents  . Developmental delay Daughter   . Stroke Maternal Grandmother   . Hypertension Maternal Grandmother   . Diabetes Paternal Grandmother   . Kidney failure Paternal Grandmother     Social History   Socioeconomic History  . Marital status: Married    Spouse name: Not on file  . Number of children: 1  . Years of education: BS  . Highest education level: Not on file  Occupational History  . Occupation: Acupuncturist  Tobacco Use  . Smoking status: Never Smoker  .  Smokeless tobacco: Never Used  Vaping Use  . Vaping Use: Never used  Substance and Sexual Activity  . Alcohol use: No  . Drug use: No  . Sexual activity: Yes    Birth control/protection: Surgical  Other Topics Concern  . 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    Social Determinants of Health   Financial Resource Strain: Not on file  Food Insecurity: Not on file  Transportation Needs: Not on file  Physical Activity: Not on file  Stress: Not on file  Social Connections: Not on file    Outpatient Medications Prior to Visit  Medication Sig Dispense Refill  . Coenzyme Q10 (CO Q 10 PO) Take 1 tablet by mouth daily.    Marland Kitchen JANUMET XR 50-1000 MG TB24 TAKE TWO TABLETS BY MOUTH DAILY 60 tablet 0  . lisinopril-hydrochlorothiazide  (ZESTORETIC) 20-25 MG tablet take 1 tablet by mouth once a day ** needs office visit** 30 tablet 1  . pravastatin (PRAVACHOL) 20 MG tablet TAKE ONE TABLET BY MOUTH ONE TIME DAILY *office visit needed for further refills* 30 tablet 0  . ondansetron (ZOFRAN) 4 MG tablet Take 1 tablet (4 mg total) by mouth every 6 (six) hours. 12 tablet 0   No facility-administered medications prior to visit.     EXAM:  BP 128/80   Pulse 64   Temp 98.2 F (36.8 C) (Oral)   Ht 5\' 4"  (1.626 m)   Wt 145 lb 3.2 oz (65.9 kg)   SpO2 99%   BMI 24.92 kg/m   Body mass index is 24.92 kg/m. Wt Readings from Last 3 Encounters:  12/09/20 145 lb 3.2 oz (65.9 kg)  11/23/19 143 lb 3.2 oz (65 kg)  09/22/19 142 lb (64.4 kg)    Physical Exam: Vital signs reviewed 11/22/19 is a well-developed well-nourished alert cooperative    who appearsr stated age in no acute distress.  HEENT: normocephalic atraumatic , Eyes: PERRL EOM's full, conjunctiva clear, Nares: paten,t no deformity discharge or tenderness., Ears: no deformity EAC's clear TMs with normal landmarks. Mouth: masked  NECK: supple without masses, thyromegaly or bruits. CHEST/PULM:  Clear to auscultation and percussion breath sounds equal no wheeze , rales or rhonchi. No chest wall deformities or tenderness. Breast: normal by inspection . No dimpling, discharge, masses, tenderness or discharge . CV: PMI is nondisplaced, S1 S2 no gallops, murmurs, rubs. Peripheral pulses are full without delay.No JVD .  ABDOMEN: Bowel sounds normal nontender  No guard or rebound, no hepato splenomegal no CVA tenderness.  Extremtities:  No clubbing cyanosis or edema, no acute joint swelling or redness no focal atrophy NEURO:  Oriented x3, cranial nerves 3-12 appear to be intact, no obvious focal weakness,gait within normal limits no abnormal reflexes or asymmetrical SKIN: No acute rashes normal turgor, color, no bruising or petechiae.  Cupping  Bruising seen on back  PSYCH:  Oriented, good eye contact, no obvious depression anxiety, cognition and judgment appear normal. LN: no cervical axillary inguinal adenopathy Pelvic: NL ext GU, labia clear without lesions or rash . Vagina no lesions .Cervix: clear  UTERUS: Neg CMT Adnexa:  clear no masses . PAP done w high risk hpv  Diabetic Foot  Exam - Simple   Simple Foot Form Diabetic Foot exam was performed with the following findings: Yes 12/09/2020 10:00 AM  Visual Inspection No deformities, no ulcerations, no other skin breakdown bilaterally: Yes Sensation Testing Intact to touch and monofilament testing bilaterally: Yes Pulse Check Posterior Tibialis and Dorsalis pulse intact bilaterally: Yes Comments      Lab Results  Component Value Date   WBC 5.8 11/23/2019   HGB 13.6 11/23/2019   HCT 40.9 11/23/2019   PLT 233.0 11/23/2019   GLUCOSE 128 (H) 11/23/2019   CHOL 220 (H) 11/23/2019   TRIG 115.0 11/23/2019   HDL 72.10 11/23/2019   LDLDIRECT 118.0 10/20/2016   LDLCALC 125 (H) 11/23/2019   ALT 15 11/23/2019   AST 15 11/23/2019   NA 140 11/23/2019   K 4.4 11/23/2019   CL 102 11/23/2019   CREATININE 0.73 11/23/2019   BUN 12 11/23/2019   CO2 29 11/23/2019   TSH 2.19 11/23/2019   INR 1.03 09/12/2011   HGBA1C 6.2 11/23/2019   MICROALBUR 35.4 (H) 11/23/2019    BP Readings from Last 3 Encounters:  12/09/20 128/80  11/23/19 116/62  09/23/19 121/71    Lab plan   ASSESSMENT AND PLAN:  Discussed the following assessment and plan:    ICD-10-CM   1. Encounter for preventative adult health care exam with abnormal findings  Z00.01 Hemoglobin A1c    TSH    Hepatic function panel    Lipid panel    BASIC METABOLIC PANEL WITH GFR    CBC with Differential/Platelet    Microalbumin / creatinine urine ratio    Celiac Disease Comprehensive Panel with Reflexes    C-reactive protein    Celiac Disease Comprehensive Panel with Reflexes    C-reactive protein    CBC with Differential/Platelet    BASIC  METABOLIC PANEL WITH GFR    Lipid panel    Hepatic function panel    TSH    Hemoglobin A1c    Microalbumin / creatinine urine ratio  2. Controlled type 2 diabetes mellitus with hyperglycemia, without long-term current use of insulin (HCC)  E11.65 Hemoglobin A1c    TSH    Hepatic function panel    Lipid panel    BASIC METABOLIC PANEL WITH GFR    CBC with Differential/Platelet    Microalbumin / creatinine urine ratio    Celiac Disease Comprehensive Panel with Reflexes    C-reactive protein    Celiac Disease Comprehensive Panel with Reflexes    C-reactive protein    CBC with Differential/Platelet    BASIC METABOLIC PANEL WITH GFR    Lipid panel    Hepatic function panel    TSH    Hemoglobin A1c    Microalbumin / creatinine urine ratio  3. PCOS (polycystic ovarian syndrome)  E28.2 Hemoglobin A1c    TSH    Hepatic function panel    Lipid panel    BASIC METABOLIC PANEL WITH GFR    CBC with Differential/Platelet    Microalbumin / creatinine urine ratio    Celiac Disease Comprehensive Panel with Reflexes    C-reactive protein    Celiac Disease Comprehensive Panel with Reflexes    C-reactive protein    CBC with Differential/Platelet    BASIC METABOLIC PANEL WITH GFR    Lipid panel    Hepatic function panel    TSH    Hemoglobin A1c    Microalbumin / creatinine urine ratio  4. Hyperlipidemia, unspecified hyperlipidemia type  E78.5 Hemoglobin A1c  TSH    Hepatic function panel    Lipid panel    BASIC METABOLIC PANEL WITH GFR    CBC with Differential/Platelet    Microalbumin / creatinine urine ratio    Celiac Disease Comprehensive Panel with Reflexes    C-reactive protein    Celiac Disease Comprehensive Panel with Reflexes    C-reactive protein    CBC with Differential/Platelet    BASIC METABOLIC PANEL WITH GFR    Lipid panel    Hepatic function panel    TSH    Hemoglobin A1c    Microalbumin / creatinine urine ratio  5. GI symptom  R19.8 Hemoglobin A1c    TSH     Hepatic function panel    Lipid panel    BASIC METABOLIC PANEL WITH GFR    CBC with Differential/Platelet    Microalbumin / creatinine urine ratio    Celiac Disease Comprehensive Panel with Reflexes    C-reactive protein    Ambulatory referral to Gastroenterology    Celiac Disease Comprehensive Panel with Reflexes    C-reactive protein    CBC with Differential/Platelet    BASIC METABOLIC PANEL WITH GFR    Lipid panel    Hepatic function panel    TSH    Hemoglobin A1c    Microalbumin / creatinine urine ratio  6. Bloating  R14.0 Hemoglobin A1c    TSH    Hepatic function panel    Lipid panel    BASIC METABOLIC PANEL WITH GFR    CBC with Differential/Platelet    Microalbumin / creatinine urine ratio    Celiac Disease Comprehensive Panel with Reflexes    C-reactive protein    Ambulatory referral to Gastroenterology    Celiac Disease Comprehensive Panel with Reflexes    C-reactive protein    CBC with Differential/Platelet    BASIC METABOLIC PANEL WITH GFR    Lipid panel    Hepatic function panel    TSH    Hemoglobin A1c    Microalbumin / creatinine urine ratio  7. Pap smear for cervical cancer screening  Z12.4 Cytology - PAP  8. Colon cancer screening needed   Z12.11 Ambulatory referral to Gastroenterology  9. Influenza vaccination declined by patient  Z28.21   lab monitoring  Continue lifestyle intervention healthy eating and exercise .  declintations   Pap  Today   Colon nad gi eval needed   [facility] who had seen her in past  She has had progressive  Sx over past 2 years and problem  Suppose  GB could be a role but advise GI eval  At this time  Uncertain  About part rx for h pylori  Not dx by endo bx .  Return for depending on results  6 mos.  Patient Care Team: Brown Dunlap, Neta MendsWanda K, MD as PCP - General (Internal Medicine) Patient Instructions  You will be contacted about referral   To GI about the symptoms  and need for colon cancer screening.   Will notify you   of labs when available.   Continue lifestyle intervention healthy eating and exercise .   Plan rov depending on labs   Etc  Or  At least 6 months    Health Maintenance, Female Adopting a healthy lifestyle and getting preventive care are important in promoting health and wellness. Ask your health care provider about:  The right schedule for you to have regular tests and exams.  Things you can do on your own to prevent  diseases and keep yourself healthy. What should I know about diet, weight, and exercise? Eat a healthy diet   Eat a diet that includes plenty of vegetables, fruits, low-fat dairy products, and lean protein.  Do not eat a lot of foods that are high in solid fats, added sugars, or sodium. Maintain a healthy weight Body mass index (BMI) is used to identify weight problems. It estimates body fat based on height and weight. Your health care provider can help determine your BMI and help you achieve or maintain a healthy weight. Get regular exercise Get regular exercise. This is one of the most important things you can do for your health. Most adults should:  Exercise for at least 150 minutes each week. The exercise should increase your heart rate and make you sweat (moderate-intensity exercise).  Do strengthening exercises at least twice a week. This is in addition to the moderate-intensity exercise.  Spend less time sitting. Even light physical activity can be beneficial. Watch cholesterol and blood lipids Have your blood tested for lipids and cholesterol at 54 years of age, then have this test every 5 years. Have your cholesterol levels checked more often if:  Your lipid or cholesterol levels are high.  You are older than 54 years of age.  You are at high risk for heart disease. What should I know about cancer screening? Depending on your health history and family history, you may need to have cancer screening at various ages. This may include screening for:  Breast  cancer.  Cervical cancer.  Colorectal cancer.  Skin cancer.  Lung cancer. What should I know about heart disease, diabetes, and high blood pressure? Blood pressure and heart disease  High blood pressure causes heart disease and increases the risk of stroke. This is more likely to develop in people who have high blood pressure readings, are of African descent, or are overweight.  Have your blood pressure checked: ? Every 3-5 years if you are 33-38 years of age. ? Every year if you are 70 years old or older. Diabetes Have regular diabetes screenings. This checks your fasting blood sugar level. Have the screening done:  Once every three years after age 13 if you are at a normal weight and have a low risk for diabetes.  More often and at a younger age if you are overweight or have a high risk for diabetes. What should I know about preventing infection? Hepatitis B If you have a higher risk for hepatitis B, you should be screened for this virus. Talk with your health care provider to find out if you are at risk for hepatitis B infection. Hepatitis C Testing is recommended for:  Everyone born from [birth year] through 1965.  Anyone with known risk factors for hepatitis C. Sexually transmitted infections (STIs)  Get screened for STIs, including gonorrhea and chlamydia, if: ? You are sexually active and are younger than 54 years of age. ? You are older than 54 years of age and your health care provider tells you that you are at risk for this type of infection. ? Your sexual activity has changed since you were last screened, and you are at increased risk for chlamydia or gonorrhea. Ask your health care provider if you are at risk.  Ask your health care provider about whether you are at high risk for HIV. Your health care provider may recommend a prescription medicine to help prevent HIV infection. If you choose to take medicine to prevent HIV, you should first get  tested for HIV. You should  then be tested every 3 months for as long as you are taking the medicine. Pregnancy  If you are about to stop having your period (premenopausal) and you may become pregnant, seek counseling before you get pregnant.  Take 400 to 800 micrograms (mcg) of folic acid every day if you become pregnant.  Ask for birth control (contraception) if you want to prevent pregnancy. Osteoporosis and menopause Osteoporosis is a disease in which the bones lose minerals and strength with aging. This can result in bone fractures. If you are 37 years old or older, or if you are at risk for osteoporosis and fractures, ask your health care provider if you should:  Be screened for bone loss.  Take a calcium or vitamin D supplement to lower your risk of fractures.  Be given hormone replacement therapy (HRT) to treat symptoms of menopause. Follow these instructions at home: Lifestyle  Do not use any products that contain nicotine or tobacco, such as cigarettes, e-cigarettes, and chewing tobacco. If you need help quitting, ask your health care provider.  Do not use street drugs.  Do not share needles.  Ask your health care provider for help if you need support or information about quitting drugs. Alcohol use  Do not drink alcohol if: ? Your health care provider tells you not to drink. ? You are pregnant, may be pregnant, or are planning to become pregnant.  If you drink alcohol: ? Limit how much you use to 0-1 drink a day. ? Limit intake if you are breastfeeding.  Be aware of how much alcohol is in your drink. In the [place] one drink equals one 12 oz bottle of beer (355 mL), one 5 oz glass of wine (148 mL), or one 1 oz glass of hard liquor (44 mL). General instructions  Schedule regular health, dental, and eye exams.  Stay current with your vaccines.  Tell your health care provider if: ? You often feel depressed. ? You have ever been abused or do not feel safe at home. Summary  Adopting a  healthy lifestyle and getting preventive care are important in promoting health and wellness.  Follow your health care provider's instructions about healthy diet, exercising, and getting tested or screened for diseases.  Follow your health care provider's instructions on monitoring your cholesterol and blood pressure. This information is not intended to replace advice given to you by your health care provider. Make sure you discuss any questions you have with your health care provider. Document Revised: 11/29/2018 Document Reviewed: 11/29/2018 Elsevier Patient Education  2020 [employer].    [place] K. Breahna Boylen M.D.

## 2020-12-09 ENCOUNTER — Other Ambulatory Visit (HOSPITAL_COMMUNITY)
Admission: RE | Admit: 2020-12-09 | Discharge: 2020-12-09 | Disposition: A | Discharge status: Home / Self Care | Payer: BC Managed Care – PPO | Source: Ambulatory Visit | Attending: Internal Medicine | Admitting: Internal Medicine

## 2020-12-09 ENCOUNTER — Encounter: Payer: Self-pay | Admitting: Internal Medicine

## 2020-12-09 ENCOUNTER — Ambulatory Visit (INDEPENDENT_AMBULATORY_CARE_PROVIDER_SITE_OTHER): Payer: BC Managed Care – PPO | Admitting: Internal Medicine

## 2020-12-09 ENCOUNTER — Other Ambulatory Visit: Payer: Self-pay

## 2020-12-09 VITALS — BP 128/80 | HR 64 | Temp 98.2°F | Ht 64.0 in | Wt 145.2 lb

## 2020-12-09 DIAGNOSIS — E1165 Type 2 diabetes mellitus with hyperglycemia: Secondary | ICD-10-CM | POA: Diagnosis not present

## 2020-12-09 DIAGNOSIS — E785 Hyperlipidemia, unspecified: Secondary | ICD-10-CM | POA: Diagnosis not present

## 2020-12-09 DIAGNOSIS — E282 Polycystic ovarian syndrome: Secondary | ICD-10-CM | POA: Diagnosis not present

## 2020-12-09 DIAGNOSIS — Z2821 Immunization not carried out because of patient refusal: Secondary | ICD-10-CM

## 2020-12-09 DIAGNOSIS — Z124 Encounter for screening for malignant neoplasm of cervix: Secondary | ICD-10-CM

## 2020-12-09 DIAGNOSIS — R198 Other specified symptoms and signs involving the digestive system and abdomen: Secondary | ICD-10-CM

## 2020-12-09 DIAGNOSIS — Z1211 Encounter for screening for malignant neoplasm of colon: Secondary | ICD-10-CM | POA: Diagnosis not present

## 2020-12-09 DIAGNOSIS — Z0001 Encounter for general adult medical examination with abnormal findings: Secondary | ICD-10-CM | POA: Diagnosis not present

## 2020-12-09 DIAGNOSIS — R140 Abdominal distension (gaseous): Secondary | ICD-10-CM

## 2020-12-09 MED ORDER — LISINOPRIL-HYDROCHLOROTHIAZIDE 20-25 MG PO TABS
ORAL_TABLET | ORAL | 3 refills | Status: DC
Start: 2020-12-09 — End: 2021-12-02

## 2020-12-09 MED ORDER — JANUMET XR 50-1000 MG PO TB24: 2.0000 | ORAL_TABLET | Freq: Every day | ORAL | 3 refills | Status: DC

## 2020-12-09 MED ORDER — PRAVASTATIN SODIUM 20 MG PO TABS: ORAL_TABLET | ORAL | 3 refills | Status: DC

## 2020-12-09 NOTE — Patient Instructions (Addendum)
You will be contacted about referral   To GI about the symptoms  and need for colon cancer screening.   Will notify you  of labs when available.   Continue lifestyle intervention healthy eating and exercise .   Plan rov depending on labs   Etc  Or  At least 6 months    Health Maintenance, Female Adopting a healthy lifestyle and getting preventive care are important in promoting health and wellness. Ask your health care provider about:  The right schedule for you to have regular tests and exams.  Things you can do on your own to prevent diseases and keep yourself healthy. What should I know about diet, weight, and exercise? Eat a healthy diet   Eat a diet that includes plenty of vegetables, fruits, low-fat dairy products, and lean protein.  Do not eat a lot of foods that are high in solid fats, added sugars, or sodium. Maintain a healthy weight Body mass index (BMI) is used to identify weight problems. It estimates body fat based on height and weight. Your health care provider can help determine your BMI and help you achieve or maintain a healthy weight. Get regular exercise Get regular exercise. This is one of the most important things you can do for your health. Most adults should:  Exercise for at least 150 minutes each week. The exercise should increase your heart rate and make you sweat (moderate-intensity exercise).  Do strengthening exercises at least twice a week. This is in addition to the moderate-intensity exercise.  Spend less time sitting. Even light physical activity can be beneficial. Watch cholesterol and blood lipids Have your blood tested for lipids and cholesterol at 54 years of age, then have this test every 5 years. Have your cholesterol levels checked more often if:  Your lipid or cholesterol levels are high.  You are older than 54 years of age.  You are at high risk for heart disease. What should I know about cancer screening? Depending on your health  history and family history, you may need to have cancer screening at various ages. This may include screening for:  Breast cancer.  Cervical cancer.  Colorectal cancer.  Skin cancer.  Lung cancer. What should I know about heart disease, diabetes, and high blood pressure? Blood pressure and heart disease  High blood pressure causes heart disease and increases the risk of stroke. This is more likely to develop in people who have high blood pressure readings, are of African descent, or are overweight.  Have your blood pressure checked: ? Every 3-5 years if you are 10-79 years of age. ? Every year if you are 35 years old or older. Diabetes Have regular diabetes screenings. This checks your fasting blood sugar level. Have the screening done:  Once every three years after age 20 if you are at a normal weight and have a low risk for diabetes.  More often and at a younger age if you are overweight or have a high risk for diabetes. What should I know about preventing infection? Hepatitis B If you have a higher risk for hepatitis B, you should be screened for this virus. Talk with your health care provider to find out if you are at risk for hepatitis B infection. Hepatitis C Testing is recommended for:  Everyone born from [birth year] through 1965.  Anyone with known risk factors for hepatitis C. Sexually transmitted infections (STIs)  Get screened for STIs, including gonorrhea and chlamydia, if: ? You are sexually active and  are younger than 54 years of age. ? You are older than 54 years of age and your health care provider tells you that you are at risk for this type of infection. ? Your sexual activity has changed since you were last screened, and you are at increased risk for chlamydia or gonorrhea. Ask your health care provider if you are at risk.  Ask your health care provider about whether you are at high risk for HIV. Your health care provider may recommend a prescription medicine to  help prevent HIV infection. If you choose to take medicine to prevent HIV, you should first get tested for HIV. You should then be tested every 3 months for as long as you are taking the medicine. Pregnancy  If you are about to stop having your period (premenopausal) and you may become pregnant, seek counseling before you get pregnant.  Take 400 to 800 micrograms (mcg) of folic acid every day if you become pregnant.  Ask for birth control (contraception) if you want to prevent pregnancy. Osteoporosis and menopause Osteoporosis is a disease in which the bones lose minerals and strength with aging. This can result in bone fractures. If you are 71 years old or older, or if you are at risk for osteoporosis and fractures, ask your health care provider if you should:  Be screened for bone loss.  Take a calcium or vitamin D supplement to lower your risk of fractures.  Be given hormone replacement therapy (HRT) to treat symptoms of menopause. Follow these instructions at home: Lifestyle  Do not use any products that contain nicotine or tobacco, such as cigarettes, e-cigarettes, and chewing tobacco. If you need help quitting, ask your health care provider.  Do not use street drugs.  Do not share needles.  Ask your health care provider for help if you need support or information about quitting drugs. Alcohol use  Do not drink alcohol if: ? Your health care provider tells you not to drink. ? You are pregnant, may be pregnant, or are planning to become pregnant.  If you drink alcohol: ? Limit how much you use to 0-1 drink a day. ? Limit intake if you are breastfeeding.  Be aware of how much alcohol is in your drink. In the [place] one drink equals one 12 oz bottle of beer (355 mL), one 5 oz glass of wine (148 mL), or one 1 oz glass of hard liquor (44 mL). General instructions  Schedule regular health, dental, and eye exams.  Stay current with your vaccines.  Tell your health care  provider if: ? You often feel depressed. ? You have ever been abused or do not feel safe at home. Summary  Adopting a healthy lifestyle and getting preventive care are important in promoting health and wellness.  Follow your health care provider's instructions about healthy diet, exercising, and getting tested or screened for diseases.  Follow your health care provider's instructions on monitoring your cholesterol and blood pressure. This information is not intended to replace advice given to you by your health care provider. Make sure you discuss any questions you have with your health care provider. Document Revised: 11/29/2018 Document Reviewed: 11/29/2018 Elsevier Patient Education  2020 [employer].

## 2020-12-10 LAB — LIPID PANEL
Cholesterol: 231 mg/dL — ABNORMAL HIGH (ref <200)
HDL: 70 mg/dL (ref >=50)
LDL Cholesterol (Calc): 127 mg/dL (calc) — ABNORMAL HIGH
Non-HDL Cholesterol (Calc): 161 mg/dL (calc) — ABNORMAL HIGH (ref <130)
Total CHOL/HDL Ratio: 3.3 (calc) (ref <5.0)
Triglycerides: 204 mg/dL — ABNORMAL HIGH (ref <150)

## 2020-12-10 LAB — CBC WITH DIFFERENTIAL/PLATELET
Absolute Monocytes: 310 cells/uL (ref 200–950)
Basophils Absolute: 50 cells/uL (ref 0–200)
Basophils Relative: 0.8 %
Eosinophils Absolute: 118 cells/uL (ref 15–500)
Eosinophils Relative: 1.9 %
HCT: 42.1 % (ref 35.0–45.0)
Hemoglobin: 14.5 g/dL (ref 11.7–15.5)
Lymphs Abs: 2331 cells/uL (ref 850–3900)
MCH: 31.3 pg (ref 27.0–33.0)
MCHC: 34.4 g/dL (ref 32.0–36.0)
MCV: 90.7 fL (ref 80.0–100.0)
MPV: 10.7 fL (ref 7.5–12.5)
Monocytes Relative: 5 %
Neutro Abs: 3391 cells/uL (ref 1500–7800)
Neutrophils Relative %: 54.7 %
Platelets: 251 10*3/uL (ref 140–400)
RBC: 4.64 10*6/uL (ref 3.80–5.10)
RDW: 12.3 % (ref 11.0–15.0)
Total Lymphocyte: 37.6 %
WBC: 6.2 10*3/uL (ref 3.8–10.8)

## 2020-12-10 LAB — BASIC METABOLIC PANEL WITH GFR
BUN: 11 mg/dL (ref 7–25)
CO2: 30 mmol/L (ref 20–32)
Calcium: 9.7 mg/dL (ref 8.6–10.4)
Chloride: 100 mmol/L (ref 98–110)
Creat: 0.73 mg/dL (ref 0.50–1.05)
GFR, Est African American: 108 mL/min/{1.73_m2} (ref >=60)
GFR, Est Non African American: 93 mL/min/{1.73_m2} (ref >=60)
Glucose, Bld: 116 mg/dL — ABNORMAL HIGH (ref 65–99)
Potassium: 3.9 mmol/L (ref 3.5–5.3)
Sodium: 138 mmol/L (ref 135–146)

## 2020-12-10 LAB — CYTOLOGY - PAP
Comment: NEGATIVE
Diagnosis: NEGATIVE
High risk HPV: NEGATIVE

## 2020-12-10 LAB — HEPATIC FUNCTION PANEL
AG Ratio: 1.7 (calc) (ref 1.0–2.5)
ALT: 16 U/L (ref 6–29)
AST: 15 U/L (ref 10–35)
Albumin: 4.6 g/dL (ref 3.6–5.1)
Alkaline phosphatase (APISO): 55 U/L (ref 37–153)
Bilirubin, Direct: 0.2 mg/dL (ref 0.0–0.2)
Globulin: 2.7 g/dL (calc) (ref 1.9–3.7)
Indirect Bilirubin: 0.8 mg/dL (calc) (ref 0.2–1.2)
Total Bilirubin: 1 mg/dL (ref 0.2–1.2)
Total Protein: 7.3 g/dL (ref 6.1–8.1)

## 2020-12-10 LAB — HEMOGLOBIN A1C
Hgb A1c MFr Bld: 6.5 % of total Hgb — ABNORMAL HIGH (ref <5.7)
Mean Plasma Glucose: 140 mg/dL
eAG (mmol/L): 7.7 mmol/L

## 2020-12-10 LAB — MICROALBUMIN / CREATININE URINE RATIO
Creatinine, Urine: 61 mg/dL (ref 20–275)
Microalb Creat Ratio: 544 mcg/mg creat — ABNORMAL HIGH (ref <30)
Microalb, Ur: 33.2 mg/dL

## 2020-12-10 LAB — CELIAC DISEASE COMPREHENSIVE PANEL WITH REFLEXES
(tTG) Ab, IgA: <1 U/mL
Immunoglobulin A: 218 mg/dL (ref 47–310)

## 2020-12-10 LAB — TSH: TSH: 1.94 mIU/L

## 2020-12-10 LAB — C-REACTIVE PROTEIN: CRP: 2.6 mg/L (ref <8.0)

## 2020-12-16 NOTE — Progress Notes (Signed)
Pap is negative negative HPV can repeat in 5 years. There is significant amount of protein in your urine although kidney function reported is normalYou are on an ACE inhibitor which is helpful for kidne protection and diabetes.     Your hemoglobin A1c is 6.5 which is acceptable  I advise we get a 24-hour urine for total protein diagnosis proteinuria if needed we can consult with nephrology after getting results back  Please arrange for this   .

## 2020-12-18 ENCOUNTER — Other Ambulatory Visit: Payer: Self-pay

## 2020-12-18 ENCOUNTER — Other Ambulatory Visit: Payer: Self-pay | Admitting: Internal Medicine

## 2020-12-18 DIAGNOSIS — R809 Proteinuria, unspecified: Secondary | ICD-10-CM

## 2020-12-18 DIAGNOSIS — E1165 Type 2 diabetes mellitus with hyperglycemia: Secondary | ICD-10-CM

## 2020-12-18 NOTE — Progress Notes (Signed)
24 hours urine protein order placed

## 2021-12-02 ENCOUNTER — Other Ambulatory Visit: Payer: Self-pay | Admitting: Internal Medicine

## 2021-12-15 ENCOUNTER — Encounter: Payer: Self-pay | Admitting: Internal Medicine

## 2021-12-15 ENCOUNTER — Ambulatory Visit (INDEPENDENT_AMBULATORY_CARE_PROVIDER_SITE_OTHER): Payer: BC Managed Care – PPO | Admitting: Internal Medicine

## 2021-12-15 VITALS — BP 122/74 | HR 72 | Temp 98.3°F | Ht 64.0 in | Wt 140.0 lb

## 2021-12-15 DIAGNOSIS — R809 Proteinuria, unspecified: Secondary | ICD-10-CM | POA: Diagnosis not present

## 2021-12-15 DIAGNOSIS — Z79899 Other long term (current) drug therapy: Secondary | ICD-10-CM

## 2021-12-15 DIAGNOSIS — E538 Deficiency of other specified B group vitamins: Secondary | ICD-10-CM

## 2021-12-15 DIAGNOSIS — E1165 Type 2 diabetes mellitus with hyperglycemia: Secondary | ICD-10-CM

## 2021-12-15 DIAGNOSIS — Z841 Family history of disorders of kidney and ureter: Secondary | ICD-10-CM

## 2021-12-15 DIAGNOSIS — Z0000 Encounter for general adult medical examination without abnormal findings: Secondary | ICD-10-CM | POA: Diagnosis not present

## 2021-12-15 DIAGNOSIS — E785 Hyperlipidemia, unspecified: Secondary | ICD-10-CM | POA: Diagnosis not present

## 2021-12-15 LAB — LIPID PANEL
Cholesterol: 209 mg/dL — ABNORMAL HIGH (ref 0–200)
HDL: 67.7 mg/dL (ref >39.00)
LDL Cholesterol: 103 mg/dL — ABNORMAL HIGH (ref 0–99)
NonHDL: 141.35
Total CHOL/HDL Ratio: 3
Triglycerides: 194 mg/dL — ABNORMAL HIGH (ref 0.0–149.0)
VLDL: 38.8 mg/dL (ref 0.0–40.0)

## 2021-12-15 LAB — CBC WITH DIFFERENTIAL/PLATELET
Basophils Absolute: 0 10*3/uL (ref 0.0–0.1)
Basophils Relative: 0.3 % (ref 0.0–3.0)
Eosinophils Absolute: 0.1 10*3/uL (ref 0.0–0.7)
Eosinophils Relative: 1.6 % (ref 0.0–5.0)
HCT: 41.3 % (ref 36.0–46.0)
Hemoglobin: 14 g/dL (ref 12.0–15.0)
Lymphocytes Relative: 35.9 % (ref 12.0–46.0)
Lymphs Abs: 2.1 10*3/uL (ref 0.7–4.0)
MCHC: 33.8 g/dL (ref 30.0–36.0)
MCV: 90.2 fl (ref 78.0–100.0)
Monocytes Absolute: 0.3 10*3/uL (ref 0.1–1.0)
Monocytes Relative: 5.8 % (ref 3.0–12.0)
Neutro Abs: 3.2 10*3/uL (ref 1.4–7.7)
Neutrophils Relative %: 56.4 % (ref 43.0–77.0)
Platelets: 249 10*3/uL (ref 150.0–400.0)
RBC: 4.59 Mil/uL (ref 3.87–5.11)
RDW: 13.4 % (ref 11.5–15.5)
WBC: 5.7 10*3/uL (ref 4.0–10.5)

## 2021-12-15 LAB — MICROALBUMIN / CREATININE URINE RATIO
Creatinine,U: 130.8 mg/dL
Microalb Creat Ratio: 110.1 mg/g — ABNORMAL HIGH (ref 0.0–30.0)
Microalb, Ur: 144 mg/dL — ABNORMAL HIGH (ref 0.0–1.9)

## 2021-12-15 LAB — VITAMIN B12: Vitamin B-12: 731 pg/mL (ref 211–911)

## 2021-12-15 LAB — BASIC METABOLIC PANEL
BUN: 15 mg/dL (ref 6–23)
CO2: 29 mEq/L (ref 19–32)
Calcium: 10.1 mg/dL (ref 8.4–10.5)
Chloride: 97 mEq/L (ref 96–112)
Creatinine, Ser: 0.79 mg/dL (ref 0.40–1.20)
GFR: 83.95 mL/min (ref >60.00)
Glucose, Bld: 130 mg/dL — ABNORMAL HIGH (ref 70–99)
Potassium: 4.1 mEq/L (ref 3.5–5.1)
Sodium: 136 mEq/L (ref 135–145)

## 2021-12-15 LAB — HEPATIC FUNCTION PANEL
ALT: 16 U/L (ref 0–35)
AST: 15 U/L (ref 0–37)
Albumin: 4.5 g/dL (ref 3.5–5.2)
Alkaline Phosphatase: 52 U/L (ref 39–117)
Bilirubin, Direct: 0.1 mg/dL (ref 0.0–0.3)
Total Bilirubin: 0.8 mg/dL (ref 0.2–1.2)
Total Protein: 7.6 g/dL (ref 6.0–8.3)

## 2021-12-15 LAB — HEMOGLOBIN A1C: Hgb A1c MFr Bld: 7 % — ABNORMAL HIGH (ref 4.6–6.5)

## 2021-12-15 LAB — TSH: TSH: 2.05 u[IU]/mL (ref 0.35–5.50)

## 2021-12-15 NOTE — Progress Notes (Signed)
Chief Complaint  Patient presents with   Annual Exam   Medication Management   Diabetes    HPI: Patient  Amber [name]  55 y.o. comes in today for Preventive Health Care visit  and med  disease fu.  Dm: lsi and  janumed  continues exercise  diet  Proteinuira? Never got consult  see last note 12 21  Gi  diarrhea  but goes away taking probiotic .   Hx of h pyloric in past. ?  Dx with serology and didn't finish med cause of se .   Gi at [facility] advised pandoscopy and she is  reluctant   HLD taking prava  Taking sl b 12 and numbness went away.  No recnet eye check  Health Maintenance  Topic Date Due   HIV Screening  Never done   FOOT EXAM  12/09/2021   Pneumococcal Vaccine 32-56 Years old (1 - PCV) 06/15/2022 (Originally 02/08/1972)   OPHTHALMOLOGY EXAM  06/15/2022 (Originally [birth year]/08/1976)   COLONOSCOPY (Pts 45-32yrs Insurance coverage will need to be confirmed)  06/15/2022 (Originally 02/07/2011)   Zoster Vaccines- Shingrix (1 of 2) 06/15/2022 (Originally 02/07/1985)   COVID-19 Vaccine (5 - Booster for Moderna series) 02/06/2022   HEMOGLOBIN A1C  06/15/2022   MAMMOGRAM  10/07/2022   TETANUS/TDAP  06/05/2023   PAP SMEAR-Modifier  12/10/2023   INFLUENZA VACCINE  Completed   Hepatitis C Screening  Completed   HPV VACCINES  Aged Out   Health Maintenance Review LIFESTYLE:  Exercise:  y Tobacco/ETS:n Alcohol: n Sugar beverages:n Sleep:ok Drug use: no HH of  3 Work:yes    parents in [place] with covid  cannot get meds or vaccine gets concerned   ROS:  REST of 12 system review negative except as per HPI   Past Medical History:  Diagnosis Date   Anemia    Diabetes mellitus    Hyperlipidemia    Hypertension     Past Surgical History:  Procedure Laterality Date   ablastion  09/2011   blood trans  08/2011   DILATION AND CURETTAGE OF UTERUS  09/2011   NO PAST SURGERIES      Family History  Problem Relation Age of Onset   Hypertension Mother    CAD Mother    Diabetes Father     CAD Father    Diabetes Other        Grandparent   Stroke Other        Grandparent   Hyperlipidemia Other        parents   Developmental delay Daughter    Stroke Maternal Grandmother    Hypertension Maternal Grandmother    Diabetes Paternal Grandmother    Kidney failure Paternal Grandmother     Social History   Socioeconomic History   Marital status: Married    Spouse name: Not on file   Number of children: 1   Years of education: BS   Highest education level: Not on file  Occupational History   Occupation: Acupuncturist  Tobacco Use   Smoking status: Never   Smokeless tobacco: Never  Vaping Use   Vaping Use: Never used  Substance and Sexual Activity   Alcohol use: No   Drug use: No   Sexual activity: Yes    Birth control/protection: Surgical  Other Topics Concern   Not on file  Social History Narrative   Married    BS degree   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    G1P1 child has developmental disability  Poss  genetically based eval at [facility]   hh of 3  34 hours per week    Neg etsFA   [place] last eye exam .    Social Determinants of Health   Financial Resource Strain: Not on file  Food Insecurity: Not on file  Transportation Needs: Not on file  Physical Activity: Not on file  Stress: Not on file  Social Connections: Not on file    Outpatient Medications Prior to Visit  Medication Sig Dispense Refill   Coenzyme Q10 (CO Q 10 PO) Take 1 tablet by mouth daily.     lisinopril-hydrochlorothiazide (ZESTORETIC) 20-25 MG tablet TAKE ONE TABLET BY MOUTH ONE TIME DAILY 90 tablet 0   pravastatin (PRAVACHOL) 20 MG tablet TAKE ONE TABLET BY MOUTH ONE TIME DAILY 90 tablet 0   SitaGLIPtin-MetFORMIN HCl (JANUMET XR) 50-1000 MG TB24 Take 2 tablets by mouth daily. 180 tablet 3   vitamin B-12 (CYANOCOBALAMIN) 100 MCG tablet Take 100 mcg by mouth daily.     No facility-administered medications prior to visit.     EXAM:  BP 122/74 (BP Location: Left Arm, Patient  Position: Sitting, Cuff Size: Normal)    Pulse 72    Temp 98.3 F (36.8 C) (Oral)    Ht 5\' 4"  (1.626 m)    Wt 140 lb (63.5 kg)    SpO2 98%    BMI 24.03 kg/m   Body mass index is 24.03 kg/m. Wt Readings from Last 3 Encounters:  12/15/21 140 lb (63.5 kg)  12/09/20 145 lb 3.2 oz (65.9 kg)  11/23/19 143 lb 3.2 oz (65 kg)    Physical Exam: Vital signs reviewed [id number] is a well-developed well-nourished alert cooperative    who appearsr stated age in no acute distress.  HEENT: normocephalic atraumatic , Eyes: PERRL EOM's full, conjunctiva clear, Nares: paten,t no deformity discharge or tenderness., Ears: no deformity EAC's clear TMs with normal landmarks. Mouth:masked NECK: supple without masses, thyromegaly or bruits. CHEST/PULM:  Clear to auscultation and percussion breath sounds equal no wheeze , rales or rhonchi. No chest wall deformities or tenderness. Breast: normal by inspection . No dimpling, discharge, masses, tenderness or discharge . CV: PMI is nondisplaced, S1 S2 no gallops, murmurs, rubs. Peripheral pulses are full without delay.No JVD .  ABDOMEN: Bowel sounds normal nontender  No guard or rebound, no hepato splenomegal no CVA tenderness.  No hernia. Extremtities:  No clubbing cyanosis or edema, no acute joint swelling or redness no focal atrophy NEURO:  Oriented x3, cranial nerves 3-12 appear to be intact, no obvious focal weakness,gait within normal limits no abnormal reflexes or asymmetrical feet look normal  SKIN: No acute rashes normal turgor, color, no bruising or petechiae. PSYCH: Oriented, good eye contact, no obvious depression anxiety, cognition and judgment appear normal. LN: no cervical axillary inguinal adenopathy  Lab Results  Component Value Date   WBC 5.7 12/15/2021   HGB 14.0 12/15/2021   HCT 41.3 12/15/2021   PLT 249.0 12/15/2021   GLUCOSE 130 (H) 12/15/2021   CHOL 209 (H) 12/15/2021   TRIG 194.0 (H) 12/15/2021   HDL 67.70 12/15/2021   LDLDIRECT 118.0  10/20/2016   LDLCALC 103 (H) 12/15/2021   ALT 16 12/15/2021   AST 15 12/15/2021   NA 136 12/15/2021   K 4.1 12/15/2021   CL 97 12/15/2021   CREATININE 0.79 12/15/2021   BUN 15 12/15/2021   CO2 29 12/15/2021   TSH 2.05 12/15/2021   INR 1.03 09/12/2011   HGBA1C 7.0 (H) 12/15/2021  MICROALBUR 144.0 (H) 12/15/2021    BP Readings from Last 3 Encounters:  12/15/21 122/74  12/09/20 128/80  11/23/19 116/62    Lab plan   reviewed with patient   ASSESSMENT AND PLAN:  Discussed the following assessment and plan:    ICD-10-CM   1. Visit for preventive health examination  123456 Basic metabolic panel    CBC with Differential/Platelet    Hemoglobin A1c    Hepatic function panel    Lipid panel    TSH    Microalbumin / creatinine urine ratio    Vitamin B12    Vitamin B12    TSH    Lipid panel    Hepatic function panel    Hemoglobin A1c    CBC with Differential/Platelet    Basic metabolic panel    Microalbumin / creatinine urine ratio    2. Hyperlipidemia, unspecified hyperlipidemia type  99991111 Basic metabolic panel    CBC with Differential/Platelet    Hemoglobin A1c    Hepatic function panel    Lipid panel    TSH    Microalbumin / creatinine urine ratio    Vitamin B12    Vitamin B12    TSH    Lipid panel    Hepatic function panel    Hemoglobin A1c    CBC with Differential/Platelet    Basic metabolic panel    Microalbumin / creatinine urine ratio    3. Controlled type 2 diabetes mellitus with hyperglycemia, without long-term current use of insulin (HCC)  123456 Basic metabolic panel    CBC with Differential/Platelet    Hemoglobin A1c    Hepatic function panel    Lipid panel    TSH    Microalbumin / creatinine urine ratio    Vitamin B12    Vitamin B12    TSH    Lipid panel    Hepatic function panel    Hemoglobin A1c    CBC with Differential/Platelet    Basic metabolic panel    Microalbumin / creatinine urine ratio    Ambulatory referral to Nephrology     4. Medication management  123456 Basic metabolic panel    CBC with Differential/Platelet    Hemoglobin A1c    Hepatic function panel    Lipid panel    TSH    Microalbumin / creatinine urine ratio    Vitamin B12    Vitamin B12    TSH    Lipid panel    Hepatic function panel    Hemoglobin A1c    CBC with Differential/Platelet    Basic metabolic panel    Microalbumin / creatinine urine ratio    5. Proteinuria, unspecified type  [id number] Basic metabolic panel    CBC with Differential/Platelet    Hemoglobin A1c    Hepatic function panel    Lipid panel    TSH    Microalbumin / creatinine urine ratio    Vitamin B12    Vitamin B12    TSH    Lipid panel    Hepatic function panel    Hemoglobin A1c    CBC with Differential/Platelet    Basic metabolic panel    Microalbumin / creatinine urine ratio    Ambulatory referral to Nephrology    6. B12 deficiency  0000000 Basic metabolic panel    CBC with Differential/Platelet    Hemoglobin A1c    Hepatic function panel    Lipid panel    TSH    Microalbumin / creatinine urine ratio  Vitamin B12    Vitamin B12    TSH    Lipid panel    Hepatic function panel    Hemoglobin A1c    CBC with Differential/Platelet    Basic metabolic panel    Microalbumin / creatinine urine ratio    7. Family history of renal failure  Z84.1 Ambulatory referral to Nephrology    Didn't get fu for proteinuria w fam hx of renal failure   plan referral to nephrology   consult   reported usually advise 24 urine but will  have renal doc opine  ,  To cont lsi  and fu as indicated  Encouraged to fu with Gi team  [facility] and that [facility] may be the preferred   because of her diarrhe resopnsive to  probiotics and  concer about h pylori in past.  Await  lab results  Return for 4-6 months , depending on results.  Patient Care Team: Madelin Headings, MD as PCP - General (Internal Medicine) Patient Instructions  Good to see you today   Labs today . Will need  follow up for protein in urine and family hx of kidney failure.  We can do a referral to nephrology for this.  If confirmed .   Bp is at goal today .   Follow and let us know if  not in control , most days.   Continue lifestyle intervention healthy eating and exercise .   Plan fu depending on lab results  or 4-6 months   Get  your eye exam  Colon cancer screening when due.   Amber [name]. Harriet Sutphen M.D.

## 2021-12-15 NOTE — Patient Instructions (Addendum)
Good to see you today   Labs today . Will need follow up for protein in urine and family hx of kidney failure.  We can do a referral to nephrology for this.  If confirmed .   Bp is at goal today .   Follow and let us know if  not in control , most days.   Continue lifestyle intervention healthy eating and exercise .   Plan fu depending on lab results  or 4-6 months   Get  your eye exam  Colon cancer screening when due.

## 2021-12-27 NOTE — Progress Notes (Signed)
There is still protein in urine and I have placed a referral to nephrology . You should be notified about an appt.  Kidney function appears normal . A1c is up to 7  I suggest adding  a  sglt2 medication either farxiga or jardiance    If agrees  please send in farxiga   5 mg per day  disp 30# refill x 3  Plan fu visit in 4 months for diabetes check w hg a1c level

## 2021-12-28 ENCOUNTER — Other Ambulatory Visit: Payer: Self-pay

## 2021-12-28 MED ORDER — DAPAGLIFLOZIN PROPANEDIOL 5 MG PO TABS: 5.0000 mg | ORAL_TABLET | Freq: Every day | ORAL | 3 refills | Status: DC

## 2022-01-05 ENCOUNTER — Telehealth: Payer: Self-pay

## 2022-01-05 NOTE — Telephone Encounter (Signed)
--  Caller states received a new prescription for Farxiga prescribed by her physician - doesn't know if it was to be taken instead of her other diabetes med or in addition to it. Denies worsening symptoms or need for triage. States she will access her mychart because it will tell her there and call the office tomorrow. Instructed not to take both meds until dosing question is clarified.

## 2022-01-05 NOTE — Telephone Encounter (Signed)
Last office visit 12/15/21 Please advise

## 2022-01-06 NOTE — Telephone Encounter (Signed)
Pt is calling back and is aware waiting on md to response to message

## 2022-01-07 NOTE — Telephone Encounter (Signed)
Message sent via MyChart.

## 2022-01-07 NOTE — Telephone Encounter (Signed)
Take In addition for now  Later we could  consider  change to metformin er( plain)   but get sugar down first  .

## 2022-03-07 ENCOUNTER — Other Ambulatory Visit: Payer: Self-pay | Admitting: Internal Medicine

## 2022-03-11 ENCOUNTER — Telehealth: Payer: Self-pay | Admitting: Internal Medicine

## 2022-03-11 NOTE — Telephone Encounter (Signed)
[place] with [facility] is calling and pt has been getting lisinopril and hctz from different provider 2 separate pills and on 03-08-2022 dr Fabian Sharp order lisinopril-hctz one pill. Please reach back out to pharm and pt ? ?

## 2022-03-12 NOTE — Telephone Encounter (Signed)
Called pt to get clarification on Lisinopril.  ?Pt states changes were made to her antihypertensives by nephrologist on 02/12/22. Please see notes in media file. Pt also states she is not taking Farxiga due to undesirable side effect of polyuria. Pt states she will continue taking Lisinopril 30mg  daily & HCTZ 25mg  prn. F/u appt scheduled with Dr for May 1. ? ?Called pharmacy to let clarify the medication problem & d/c lisinopril combo pill.  ? ?Updated medication list to reflect patient reported changes. ?

## 2022-03-15 ENCOUNTER — Ambulatory Visit: Payer: Self-pay | Admitting: Internal Medicine

## 2022-04-18 NOTE — Progress Notes (Signed)
? ?Chief Complaint  ?Patient presents with  ? Follow-up  ? ? ?HPI: ?Amber [name] 56 y.o. come in for Chronic disease management : ? ? BP : goes up when off hctz otherwise controlled  ? ?DM taking farxiga  2.5 about 3 d per week cause does help  her but se is frequent urination yeast vag sx and  halitosis . Still taking janumet 2 per day 50/1000  ? ?Nephrologist  advised no need to take the hctz  but she feels bp goes up if not on this  or other.  ? ?Exercising well hiking and tennis. Feels farxiga helps her exercise tolerance . ?Align    2 per day  no more diarrhea ? ?Acupunture for myofascial pain  neck   ?Over all doing well trying to balance  se of meds with good effects  ?ROS: See pertinent positives and negatives per HPI. ? ?Past Medical History:  ?Diagnosis Date  ? Anemia   ? Diabetes mellitus   ? Hyperlipidemia   ? Hypertension   ? ? ?Family History  ?Problem Relation Age of Onset  ? Hypertension Mother   ? CAD Mother   ? Diabetes Father   ? CAD Father   ? Diabetes Other   ?     Grandparent  ? Stroke Other   ?     Grandparent  ? Hyperlipidemia Other   ?     parents  ? Developmental delay Daughter   ? Stroke Maternal Grandmother   ? Hypertension Maternal Grandmother   ? Diabetes Paternal Grandmother   ? Kidney failure Paternal Grandmother   ? ? ?Social History  ? ?Socioeconomic History  ? Marital status: Married  ?  Spouse name: Not on file  ? Number of children: 1  ? Years of education: BS  ? Highest education level: Not on file  ?Occupational History  ? Occupation: Acupuncturist  ?Tobacco Use  ? Smoking status: Never  ? Smokeless tobacco: Never  ?Vaping Use  ? Vaping Use: Never used  ?Substance and Sexual Activity  ? Alcohol use: No  ? Drug use: No  ? Sexual activity: Yes  ?  Birth control/protection: Surgical  ?Other Topics Concern  ? Not on file  ?Social History Narrative  ? Married   ? BS degree  ? 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   ? G1P1 child has developmental disability  Poss genetically based eval  at [facility]  ? hh of 3  34 hours per week   ? Neg etsFA  ? [place] last eye exam .   ? ?Social Determinants of Health  ? ?Financial Resource Strain: Not on file  ?Food Insecurity: Not on file  ?Transportation Needs: Not on file  ?Physical Activity: Not on file  ?Stress: Not on file  ?Social Connections: Not on file  ? ? ?Outpatient Medications Prior to Visit  ?Medication Sig Dispense Refill  ? Coenzyme Q10 (CO Q 10 PO) Take 1 tablet by mouth daily.    ? dapagliflozin propanediol (FARXIGA) 5 MG TABS tablet Take 1 tablet (5 mg total) by mouth daily. Or as tolerated 30 tablet 3  ? hydrochlorothiazide (HYDRODIURIL) 25 MG tablet Take 25 mg by mouth daily.    ? lisinopril (ZESTRIL) 30 MG tablet Take 30 mg by mouth daily.    ? pravastatin (PRAVACHOL) 20 MG tablet TAKE ONE TABLET BY MOUTH ONE TIME DAILY 90 tablet 0  ? SitaGLIPtin-MetFORMIN HCl (JANUMET XR) 50-1000 MG TB24 Take 2 tablets by mouth daily.  180 tablet 3  ? vitamin B-12 (CYANOCOBALAMIN) 100 MCG tablet Take 100 mcg by mouth daily.    ? ?No facility-administered medications prior to visit.  ? ? ? ?EXAM: ? ?BP 122/90   Pulse 61   Temp 98.1 ?F (36.7 ?C) (Oral)   Ht 5\' 4"  (1.626 m)   Wt 139 lb (63 kg)   SpO2 99%   BMI 23.86 kg/m?  ? ?Body mass index is 23.86 kg/m?. ?Wt Readings from Last 3 Encounters:  ?04/19/22 139 lb (63 kg)  ?12/15/21 140 lb (63.5 kg)  ?12/09/20 145 lb 3.2 oz (65.9 kg)  ? ? ?GENERAL: vitals reviewed and listed above, alert, oriented, appears well hydrated and in no acute distress ?HEENT: atraumatic, conjunctiva  clear, no obvious abnormalities on inspection of external nose and ears  ?NECK: no obvious masses on inspection palpation  ?MS: moves all extremities without noticeable focal  abnormality ?PSYCH: pleasant and cooperative, no obvious depression or anxiety ?Lab Results  ?Component Value Date  ? WBC 5.7 12/15/2021  ? HGB 14.0 12/15/2021  ? HCT 41.3 12/15/2021  ? PLT 249.0 12/15/2021  ? GLUCOSE 130 (H) 12/15/2021  ? CHOL 209 (H) 12/15/2021  ?  TRIG 194.0 (H) 12/15/2021  ? HDL 67.70 12/15/2021  ? LDLDIRECT 118.0 10/20/2016  ? LDLCALC 103 (H) 12/15/2021  ? ALT 16 12/15/2021  ? AST 15 12/15/2021  ? NA 136 12/15/2021  ? K 4.1 12/15/2021  ? CL 97 12/15/2021  ? CREATININE 0.79 12/15/2021  ? BUN 15 12/15/2021  ? CO2 29 12/15/2021  ? TSH 2.05 12/15/2021  ? INR 1.03 09/12/2011  ? HGBA1C 6.4 (A) 04/19/2022  ? MICROALBUR 144.0 (H) 12/15/2021  ? ?BP Readings from Last 3 Encounters:  ?04/19/22 122/90  ?12/15/21 122/74  ?12/09/20 128/80  ? ?Reviewed   a1c  labs  ?ASSESSMENT AND PLAN: ? ?Discussed the following assessment and plan: ? ?Controlled type 2 diabetes mellitus with hyperglycemia, without long-term current use of insulin (HCC) - Plan: POC HgB A1c ? ?Medication management ? ?Essential hypertension ?Has had good effect from farxiga  but is titrating   some days to limit frequent urination and  halitosis .  Makes her feel better  and better exercise tolerance  all over   ?Plan cpx and labs pre visit in October November  ? ?-Patient advised to return or notify health care team  if  new concerns arise. ? ?Patient Instructions  ?Good to see you today  ?HG a1c was 6.4  very good.  ?Ok to titrate the Amber [name] . ? ?Plan cpx with labs pre visit  in October November.  ? ? ?10-03-1989. Lamia Mariner M.D. ?

## 2022-04-19 ENCOUNTER — Encounter: Payer: Self-pay | Admitting: Internal Medicine

## 2022-04-19 ENCOUNTER — Ambulatory Visit: Payer: BC Managed Care – PPO | Admitting: Internal Medicine

## 2022-04-19 VITALS — BP 122/90 | HR 61 | Temp 98.1°F | Ht 64.0 in | Wt 139.0 lb

## 2022-04-19 DIAGNOSIS — E1165 Type 2 diabetes mellitus with hyperglycemia: Secondary | ICD-10-CM

## 2022-04-19 DIAGNOSIS — Z0000 Encounter for general adult medical examination without abnormal findings: Secondary | ICD-10-CM

## 2022-04-19 DIAGNOSIS — T887XXA Unspecified adverse effect of drug or medicament, initial encounter: Secondary | ICD-10-CM

## 2022-04-19 DIAGNOSIS — I10 Essential (primary) hypertension: Secondary | ICD-10-CM

## 2022-04-19 DIAGNOSIS — E538 Deficiency of other specified B group vitamins: Secondary | ICD-10-CM

## 2022-04-19 DIAGNOSIS — E785 Hyperlipidemia, unspecified: Secondary | ICD-10-CM

## 2022-04-19 DIAGNOSIS — Z79899 Other long term (current) drug therapy: Secondary | ICD-10-CM

## 2022-04-19 DIAGNOSIS — R809 Proteinuria, unspecified: Secondary | ICD-10-CM

## 2022-04-19 LAB — POCT GLYCOSYLATED HEMOGLOBIN (HGB A1C): Hemoglobin A1C: 6.4 % — AB (ref 4.0–5.6)

## 2022-04-19 NOTE — Patient Instructions (Signed)
Good to see you today  ?HG a1c was 6.4  very good.  ?Ok to titrate the [place] . ? ?Plan cpx with labs pre visit  in October November.  ?

## 2022-06-05 ENCOUNTER — Other Ambulatory Visit: Payer: Self-pay | Admitting: Internal Medicine

## 2022-06-13 ENCOUNTER — Other Ambulatory Visit: Payer: Self-pay | Admitting: Internal Medicine

## 2022-07-12 ENCOUNTER — Other Ambulatory Visit: Payer: Self-pay | Admitting: Internal Medicine

## 2022-07-28 ENCOUNTER — Telehealth: Payer: Self-pay | Admitting: Internal Medicine

## 2022-07-28 NOTE — Telephone Encounter (Signed)
Pt called requesting to speak to CMA. Pt was asked if it was for a medication refill, as I could help her with that. Pt stated it was for something else and would rather speak to CMA.  Please call her back at: [phone]

## 2022-08-03 ENCOUNTER — Ambulatory Visit: Payer: BC Managed Care – PPO | Admitting: Internal Medicine

## 2022-08-03 ENCOUNTER — Other Ambulatory Visit: Payer: Self-pay | Admitting: Internal Medicine

## 2022-08-04 ENCOUNTER — Other Ambulatory Visit: Payer: Self-pay | Admitting: Internal Medicine

## 2022-08-04 ENCOUNTER — Ambulatory Visit: Payer: BC Managed Care – PPO | Admitting: Internal Medicine

## 2022-08-04 MED ORDER — DAPAGLIFLOZIN PROPANEDIOL 5 MG PO TABS: 5.0000 mg | ORAL_TABLET | Freq: Every day | ORAL | 0 refills | Status: DC

## 2022-08-09 ENCOUNTER — Other Ambulatory Visit: Payer: Self-pay | Admitting: Internal Medicine

## 2022-08-11 MED ORDER — DAPAGLIFLOZIN PROPANEDIOL 5 MG PO TABS: 5.0000 mg | ORAL_TABLET | Freq: Every day | ORAL | 0 refills | Status: DC

## 2022-09-29 ENCOUNTER — Other Ambulatory Visit: Payer: Self-pay | Admitting: Internal Medicine

## 2022-10-01 ENCOUNTER — Other Ambulatory Visit: Payer: Self-pay | Admitting: Internal Medicine

## 2022-10-01 NOTE — Telephone Encounter (Signed)
Pt is due for labs as well as a dm follow up. Pt has an upcoming appt. 10/13/2022.

## 2022-10-04 MED ORDER — DAPAGLIFLOZIN PROPANEDIOL 5 MG PO TABS: 5.0000 mg | ORAL_TABLET | Freq: Every day | ORAL | 0 refills | Status: DC

## 2022-10-04 NOTE — Telephone Encounter (Signed)
Pt is calling back about  her meditation and stated she is out.

## 2022-10-13 ENCOUNTER — Other Ambulatory Visit: Payer: Self-pay | Admitting: Internal Medicine

## 2022-10-13 ENCOUNTER — Ambulatory Visit: Payer: BC Managed Care – PPO | Admitting: Internal Medicine

## 2022-10-13 VITALS — BP 120/80 | HR 77 | Temp 97.5°F | Wt 137.6 lb

## 2022-10-13 DIAGNOSIS — Z79899 Other long term (current) drug therapy: Secondary | ICD-10-CM | POA: Diagnosis not present

## 2022-10-13 DIAGNOSIS — Z87898 Personal history of other specified conditions: Secondary | ICD-10-CM | POA: Diagnosis not present

## 2022-10-13 DIAGNOSIS — R5383 Other fatigue: Secondary | ICD-10-CM | POA: Diagnosis not present

## 2022-10-13 DIAGNOSIS — E1165 Type 2 diabetes mellitus with hyperglycemia: Secondary | ICD-10-CM

## 2022-10-13 DIAGNOSIS — E785 Hyperlipidemia, unspecified: Secondary | ICD-10-CM | POA: Diagnosis not present

## 2022-10-13 DIAGNOSIS — I10 Essential (primary) hypertension: Secondary | ICD-10-CM | POA: Diagnosis not present

## 2022-10-13 DIAGNOSIS — E782 Mixed hyperlipidemia: Secondary | ICD-10-CM

## 2022-10-13 LAB — HEPATIC FUNCTION PANEL
ALT: 15 U/L (ref 0–35)
AST: 15 U/L (ref 0–37)
Albumin: 4.6 g/dL (ref 3.5–5.2)
Alkaline Phosphatase: 72 U/L (ref 39–117)
Bilirubin, Direct: 0.1 mg/dL (ref 0.0–0.3)
Total Bilirubin: 0.6 mg/dL (ref 0.2–1.2)
Total Protein: 7.5 g/dL (ref 6.0–8.3)

## 2022-10-13 LAB — POCT GLYCOSYLATED HEMOGLOBIN (HGB A1C): Hemoglobin A1C: 6.5 % — AB (ref 4.0–5.6)

## 2022-10-13 LAB — LIPID PANEL
Cholesterol: 211 mg/dL — ABNORMAL HIGH (ref 0–200)
HDL: 61.2 mg/dL (ref >39.00)
Total CHOL/HDL Ratio: 3
Triglycerides: 404 mg/dL — ABNORMAL HIGH (ref 0.0–149.0)

## 2022-10-13 LAB — TSH: TSH: 1.68 u[IU]/mL (ref 0.35–5.50)

## 2022-10-13 LAB — CBC WITH DIFFERENTIAL/PLATELET
Basophils Absolute: 0 10*3/uL (ref 0.0–0.1)
Basophils Relative: 0.7 % (ref 0.0–3.0)
Eosinophils Absolute: 0.1 10*3/uL (ref 0.0–0.7)
Eosinophils Relative: 1.5 % (ref 0.0–5.0)
HCT: 44 % (ref 36.0–46.0)
Hemoglobin: 15 g/dL (ref 12.0–15.0)
Lymphocytes Relative: 36.2 % (ref 12.0–46.0)
Lymphs Abs: 2.2 10*3/uL (ref 0.7–4.0)
MCHC: 34 g/dL (ref 30.0–36.0)
MCV: 91.1 fl (ref 78.0–100.0)
Monocytes Absolute: 0.3 10*3/uL (ref 0.1–1.0)
Monocytes Relative: 4.8 % (ref 3.0–12.0)
Neutro Abs: 3.5 10*3/uL (ref 1.4–7.7)
Neutrophils Relative %: 56.8 % (ref 43.0–77.0)
Platelets: 249 10*3/uL (ref 150.0–400.0)
RBC: 4.83 Mil/uL (ref 3.87–5.11)
RDW: 13.4 % (ref 11.5–15.5)
WBC: 6.1 10*3/uL (ref 4.0–10.5)

## 2022-10-13 LAB — BASIC METABOLIC PANEL
BUN: 19 mg/dL (ref 6–23)
CO2: 30 mEq/L (ref 19–32)
Calcium: 9.9 mg/dL (ref 8.4–10.5)
Chloride: 102 mEq/L (ref 96–112)
Creatinine, Ser: 0.78 mg/dL (ref 0.40–1.20)
GFR: 84.75 mL/min (ref >60.00)
Glucose, Bld: 151 mg/dL — ABNORMAL HIGH (ref 70–99)
Potassium: 3.8 mEq/L (ref 3.5–5.1)
Sodium: 140 mEq/L (ref 135–145)

## 2022-10-13 LAB — LDL CHOLESTEROL, DIRECT: Direct LDL: 119 mg/dL

## 2022-10-13 MED ORDER — PRAVASTATIN SODIUM 20 MG PO TABS: 20.0000 mg | ORAL_TABLET | Freq: Every day | ORAL | 3 refills | Status: DC

## 2022-10-13 MED ORDER — DAPAGLIFLOZIN PROPANEDIOL 10 MG PO TABS: 10.0000 mg | ORAL_TABLET | Freq: Every day | ORAL | 6 refills | Status: DC

## 2022-10-13 NOTE — Patient Instructions (Addendum)
Sound like dehydration from a stomach  infection could have caused the problem and the farxiga and metformin could have added temororaily  to   dehydration.     Will do cardiology consult  for risk assessment .   Rx farxiga to 10 mg per day as planned.  Non fasting lab today .  Plan follow up  depending on lab or 3-4 months

## 2022-10-13 NOTE — Progress Notes (Unsigned)
Chief Complaint  Patient presents with   Follow-up   Diabetes    HPI: Amber [name] 56 y.o. come in for Chronic disease management  and new problem Back  from [place] visitng relatives   oct 10 range  Had episode of nv  diarrhea  cna mid chest pain  sob on Sept 15   was told her co2 was high and ? Acid or dehydration    had ekg and told not heart . Given  iv fluids and felt much better   .  Was on farxiga metformin at the time . Had no fever and got better but still feels not to baseline .  She had been up to 10 farxiga  cause her to  feel much better and better exrcise tolerance .  Dm controlled. Wonders if  sx were cardiac or pancreas in nature.   Although now better No current cp sob  abd pain eating normally.   Daughter got coughing illness in [place]  resolving not  needed medical visits.  Neg covid.    ROS: See pertinent positives and negatives per HPI. No current fever unintended weight loss . Exercise tolerance not yet back to baseline but much better   Past Medical History:  Diagnosis Date   Anemia    Diabetes mellitus    Hyperlipidemia    Hypertension     Family History  Problem Relation Age of Onset   Hypertension Mother    CAD Mother    Diabetes Father    CAD Father    Diabetes Other        Grandparent   Stroke Other        Grandparent   Hyperlipidemia Other        parents   Developmental delay Daughter    Stroke Maternal Grandmother    Hypertension Maternal Grandmother    Diabetes Paternal Grandmother    Kidney failure Paternal Grandmother     Social History   Socioeconomic History   Marital status: Married    Spouse name: Not on file   Number of children: 1   Years of education: BS   Highest education level: Not on file  Occupational History   Occupation: Acupuncturist  Tobacco Use   Smoking status: Never   Smokeless tobacco: Never  Vaping Use   Vaping Use: Never used  Substance and Sexual Activity   Alcohol use: No   Drug use: No   Sexual  activity: Yes    Birth control/protection: Surgical  Other Topics Concern   Not on file  Social History Narrative   Married    BS degree   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    Social Determinants of Health   Financial Resource Strain: Not on file  Food Insecurity: Not on file  Transportation Needs: Not on file  Physical Activity: Not on file  Stress: Not on file  Social Connections: Not on file    Outpatient Medications Prior to Visit  Medication Sig Dispense Refill   Coenzyme Q10 (CO Q 10 PO) Take 1 tablet by mouth daily.     lisinopril (ZESTRIL) 30 MG tablet Take 30 mg by mouth daily.     SitaGLIPtin-MetFORMIN HCl (JANUMET XR) 50-1000 MG TB24 Take 2 tablets by mouth daily. 180  tablet 3   vitamin B-12 (CYANOCOBALAMIN) 100 MCG tablet Take 100 mcg by mouth daily.     dapagliflozin propanediol (FARXIGA) 5 MG TABS tablet Take 1 tablet (5 mg total) by mouth daily. 30 tablet 0   hydrochlorothiazide (HYDRODIURIL) 25 MG tablet Take 25 mg by mouth daily.     pravastatin (PRAVACHOL) 20 MG tablet TAKE ONE TABLET BY MOUTH ONE TIME DAILY 90 tablet 0   No facility-administered medications prior to visit.     EXAM:  BP 120/80 (BP Location: Left Arm, Patient Position: Sitting, Cuff Size: Normal)   Pulse 77   Temp (!) 97.5 F (36.4 C) (Oral)   Wt 137 lb 9.6 oz (62.4 kg)   SpO2 95%   BMI 23.62 kg/m   Body mass index is 23.62 kg/m.  GENERAL: vitals reviewed and listed above, alert, oriented, appears well hydrated and in no acute distress HEENT: atraumatic, conjunctiva  clear, no obvious abnormalities on inspection of external nose and ears NECK: no obvious masses on inspection palpation  LUNGS: clear to auscultation bilaterally, no wheezes, rales or rhonchi, good air movement CV: HRRR, no clubbing cyanosis or  peripheral edema nl  cap refill  Abdomen:  Sof,t normal bowel sounds without hepatosplenomegaly, no guarding rebound or masses no CVA tenderness MS: moves all extremities without noticeable focal  abnormality PSYCH: pleasant and cooperative, no obvious depression or anxiety Lab Results  Component Value Date   WBC 6.1 10/13/2022   HGB 15.0 10/13/2022   HCT 44.0 10/13/2022   PLT 249.0 10/13/2022   GLUCOSE 151 (H) 10/13/2022   CHOL 211 (H) 10/13/2022   TRIG (H) 10/13/2022    404.0 Triglyceride is over 400; calculations on Lipids are invalid.   HDL 61.20 10/13/2022   LDLDIRECT 119.0 10/13/2022   LDLCALC 103 (H) 12/15/2021   ALT 15 10/13/2022   AST 15 10/13/2022   NA 140 10/13/2022   K 3.8 10/13/2022   CL 102 10/13/2022   CREATININE 0.78 10/13/2022   BUN 19 10/13/2022   CO2 30 10/13/2022   TSH 1.68 10/13/2022   INR 1.03 09/12/2011   HGBA1C 6.5 (A) 10/13/2022   MICROALBUR 144.0 (H) 12/15/2021   BP Readings from Last 3 Encounters:  10/13/22 120/80  04/19/22 122/90  12/15/21 122/74    ASSESSMENT AND PLAN:  Discussed the following assessment and plan:  History of precordial chest pain - Plan: Ambulatory referral to Cardiology  Controlled type 2 diabetes mellitus with hyperglycemia, without long-term current use of insulin (HCC) - Plan: POC HgB A1c, Basic metabolic panel, CBC with Differential/Platelet, Hepatic function panel, TSH, Lipid panel, Lipid panel, TSH, Hepatic function panel, CBC with Differential/Platelet, Basic metabolic panel  Other fatigue - Plan: Basic metabolic panel, CBC with Differential/Platelet, Hepatic function panel, TSH, Lipid panel, Lipid panel, TSH, Hepatic function panel, CBC with Differential/Platelet, Basic metabolic panel, Ambulatory referral to Cardiology  Medication management - Plan: Basic metabolic panel, CBC with Differential/Platelet, Hepatic function panel, TSH, Lipid panel, Lipid panel, TSH, Hepatic function panel, CBC with Differential/Platelet, Basic metabolic  panel  Essential hypertension - Plan: Basic metabolic panel, CBC with Differential/Platelet, Hepatic function panel, TSH, Lipid panel, Lipid panel, TSH, Hepatic function panel, CBC with Differential/Platelet, Basic metabolic panel  Hyperlipidemia, unspecified hyperlipidemia type - Plan: Basic metabolic panel, CBC with Differential/Platelet, Hepatic function panel, TSH, Lipid panel, Lipid panel, TSH, Hepatic function panel, CBC with Differential/Platelet, Basic metabolic panel Suspect  episode was aggravated by sglt2 and metformin    Cards consult referral to evaluate cv risk  with episode of cp albeit related to vomiting and dehydration.  Will ; update lab  non fasting  today  -Patient advised to return or notify health care team  if  new concerns arise. Eval  disc  counsel   Patient Instructions  Sound like dehydration from a stomach  infection could have caused the problem and the farxiga and metformin could have added temororaily  to   dehydration.     Will do cardiology consult  for risk assessment .   Rx farxiga to 10 mg per day as planned.  Non fasting lab today .  Plan follow up  depending on lab or 3-4 months    Britteny Fiebelkorn K. Ulisses Vondrak M.D.

## 2022-10-14 ENCOUNTER — Encounter: Payer: Self-pay | Admitting: Internal Medicine

## 2022-10-17 NOTE — Progress Notes (Signed)
So thyroid kidney liver all normal . Triglycerides are way up since  you were non fasting  Get the triglycerieds down with attention to diet and activity. .   Ldl is not at goal   are  you back to taking pravastatin 20 ?    would try 40 mg pravastatin   to help get down ldl . You can also ask cardiology advice about lipid control . Sometimes we add 2 meds .  Let me know how you want to follow up .Marland Kitchen We can do a  fu virtual visit to discuss  if needed

## 2022-10-18 NOTE — Progress Notes (Signed)
Cardiology Office Note:    Date:  10/19/2022   ID:  Amber [name], DOB August 11, 1966, MRN [id number]  PCP:  Madelin Headings, MD   [name] Providers Cardiologist:  None     Referring MD: Madelin Headings, MD   History of Present Illness:    Amber [name] is a 56 y.o. female here for the evaluation of fatigue and precordial chest pain at the request of Dr. Fabian Sharp. Also known to have hypertension, hyperlipidemia, diabetes, and anemia.  She saw Dr. Fabian Sharp 10/13/2022 where she reported that on 09/03/2022 she had an episode of mid-chest pain associated with shortness of breath, nausea, vomiting, and diarrhea. At the time she had an EKG that was normal and was told her CO2 was high. She was given IV fluids and felt much better. She was on farxiga and metformin. At her visit with Dr. Fabian Sharp it was also noted that her exercise tolerance was not yet back to baseline. It was suspected her episode was related to dehydration secondary to a stomach infection, aggravated by sglt2 and metformin. She was referred to cardiology for evaluation of CV risk with episode of chest pain albeit related to vomiting and dehydration. Thyroid, kidney, and liver lab work was normal (creatinine 0.78, ALT 15, TSH 1.68). Triglycerides were 404 (non-fasting). Her LDL was 119 and not at goal on 20 mg pravastatin, so she was advised to discuss lipid control with cardiology.  She had seen Dr. Duke Salvia 03/18/2017 for chest discomfort in her neck, chest, and back. This was changed with pressing into her chest, but unchanged with exertion. Her symptoms resolved with using Chinese heart pills containing nitroglycerin and herbs. It was noted that Amber [name] saw Dr. Rennis Golden 06/2014 for similar chest pain. She was referred for stress testing but never went for the test. Dr. Duke Salvia again referred her for an exercise Myoview. She had a normal stress test 03/2017 revealing LVEF 61% and was low risk. Carotid dopplers 03/2017 were normal with no blockages. At her  visit with Dr. Duke Salvia her blood pressure was well controlled but she reported higher blood pressures prior to taking her morning dose. It was recommended she take her HCTZ in the morning and lisinopril at night. She was also started on 40 mg pravastatin daily.  Today: On Sept. 15th she initially felt nauseous for 2 days and then went to the ER due to development of central chest pain. Her EKG at the time was normal. She was treated for diabetic ketoacidosis. Since then she has noticed significant loss of muscle mass, especially in her arms.   At her visit today she denies recurring chest pain. This is reportedly improved since starting [place]. However, she states that she always has neck pain and a headache. She believes she has plaque in her left carotid.  She endorses diabetes. Previous A1c was 6.5. Her Amber [name] has helped significantly.  No smoking.  She denies any palpitations, shortness of breath, or peripheral edema. No lightheadedness, syncope, orthopnea, or PND.   Past Medical History:  Diagnosis Date   Anemia    Diabetes mellitus    Hyperlipidemia    Hypertension     Past Surgical History:  Procedure Laterality Date   ablastion  09/2011   blood trans  08/2011   DILATION AND CURETTAGE OF UTERUS  09/2011   NO PAST SURGERIES      Current Medications: Current Meds  Medication Sig   Coenzyme Q10 (CO Q 10 PO) Take 1 tablet by mouth daily.  dapagliflozin propanediol (FARXIGA) 10 MG TABS tablet Take 1 tablet (10 mg total) by mouth daily before breakfast. Dosage change   lisinopril (ZESTRIL) 30 MG tablet Take 30 mg by mouth daily.   metoprolol tartrate (LOPRESSOR) 50 MG tablet Take 1 tablet (50 mg total) by mouth as directed. Take 1 tablet 2 hours before CT scan   pravastatin (PRAVACHOL) 20 MG tablet Take 1 tablet (20 mg total) by mouth daily.   SitaGLIPtin-MetFORMIN HCl (JANUMET XR) 50-1000 MG TB24 Take 2 tablets by mouth daily.   vitamin B-12 (CYANOCOBALAMIN) 100 MCG  tablet Take 100 mcg by mouth daily.     Allergies:   Actos [pioglitazone], Other, and Pravastatin   Social History   Socioeconomic History   Marital status: Married    Spouse name: Not on file   Number of children: 1   Years of education: BS   Highest education level: Not on file  Occupational History   Occupation: Acupuncturist  Tobacco Use   Smoking status: Never   Smokeless tobacco: Never  Vaping Use   Vaping Use: Never used  Substance and Sexual Activity   Alcohol use: No   Drug use: No   Sexual activity: Yes    Birth control/protection: Surgical  Other Topics Concern   Not on file  Social History Narrative   Married    BS degree   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    Social Determinants of Health   Financial Resource Strain: Not on file  Food Insecurity: Not on file  Transportation Needs: Not on file  Physical Activity: Not on file  Stress: Not on file  Social Connections: Not on file     Family History: The patient's family history includes CAD in her father and mother; Developmental delay in her daughter; Diabetes in her daughter, father, paternal grandmother, and another family member; Healthy in her brother; Hyperlipidemia in an other family member; Hypertension in her maternal grandmother and mother; Kidney failure in her paternal grandmother; Stroke in her maternal grandmother and another family member.  ROS:   Please see the history of present illness.    (+) Neck pain (+) Headache (+) Muscular dystrophy All other systems reviewed and are negative.  EKGs/Labs/Other Studies Reviewed:    The following studies were reviewed today:  Bilateral Carotid Doppler  04/15/2017: Impressions: Normal carotid arteries, bilaterally. Normal subclavian arteries, bilaterally. Patent vertebral arteries with  antegrade flow.  Exercise Stress Myoview  04/15/2017: The patient walked for 9:30 of a Bruce protocol GXT. Peak HR of 155 which is 91% predicted maximal HR . There were no ST or T wave changes to suggest ischemia Nuclear stress EF: 61%. Normal LV function The study is normal. This is a low risk study.  EKG:  EKG is personally reviewed and interpreted. 10/19/2022: Sinus bradycardia. Rate 59 bpm. No acute changes.  Recent Labs: 10/13/2022: ALT 15; BUN 19; Creatinine, Ser 0.78; Hemoglobin 15.0; Platelets 249.0; Potassium 3.8; Sodium 140; TSH 1.68   Recent Lipid Panel    Component Value Date/Time   CHOL 211 (H) 10/13/2022 1139   TRIG (H) 10/13/2022 1139    404.0 Triglyceride is over 400; calculations on Lipids are invalid.   HDL 61.20 10/13/2022 1139   CHOLHDL 3 10/13/2022 1139   VLDL 38.8 12/15/2021  [id number]   LDLCALC 103 (H) 12/15/2021 0953   LDLCALC 127 (H) 12/09/2020 1056   LDLDIRECT 119.0 10/13/2022 1139     Risk Assessment/Calculations:          Physical Exam:    VS:  BP 120/70   Pulse 64   Ht 5\' 2"  (1.575 m)   Wt 138 lb (62.6 kg)   SpO2 99%   BMI 25.24 kg/m     Wt Readings from Last 3 Encounters:  10/19/22 138 lb (62.6 kg)  10/13/22 137 lb 9.6 oz (62.4 kg)  04/19/22 139 lb (63 kg)     GEN: Well nourished, well developed in no acute distress HEENT: Normal NECK: No JVD; No carotid bruits LYMPHATICS: No lymphadenopathy CARDIAC: RRR, no murmurs, rubs, gallops RESPIRATORY:  Clear to auscultation without rales, wheezing or rhonchi  ABDOMEN: Soft, non-tender, non-distended MUSCULOSKELETAL:  No edema; No deformity  SKIN: Warm and dry NEUROLOGIC:  Alert and oriented x 3 PSYCHIATRIC:  Normal affect   ASSESSMENT:    1. Precordial chest pain   2. Neck pain   3. Diabetes mellitus with coincident hypertension (HCC)   4. Pure hypercholesterolemia   5. Pre-procedure lab exam    PLAN:    In order of problems listed above:  Chest discomfort/chest pain in the  setting of diabetes, strong coronary artery disease equivalent risk factor - We will go ahead and check a coronary CT scan to ensure that she does not have any high-grade plaque present.  She currently takes pravastatin.  If necessary, can always intensify cholesterol management.  Last LDL was 103.  Neck pain - She was asking if we could check her carotid Dopplers again.  Back in 2018 approximately 5 years ago there was no significant plaque present.  Diabetes with hypertension - Currently on ACE inhibitor for renal protection.  Excellent.  Last hemoglobin A1c in the 6.5 range.  Excellent.  Her symptom in [place] of epigastric discomfort nausea vomiting resulting in diabetic ketoacidosis reviewed with her.  She was dehydrated at the time. -Excellent use of [place]Farxiga.  She states that she has felt much better since being on this medication.   Hyperlipidemia mixed - Triglycerides have been high in the 400 range previously.  At last check 194 with LDL of 103.  Follow-up:  PRN. We will follow-up with the results of testing.  Medication Adjustments/Labs and Tests Ordered: Current medicines are reviewed at length with the patient today.  Concerns regarding medicines are outlined above.   Orders Placed This Encounter  Procedures   CT CORONARY MORPH W/CTA COR W/SCORE W/CA W/CM &/OR WO/CM   Basic metabolic panel   EKG 12-Lead   VAS US CAROTID   Meds ordered this encounter  Medications   metoprolol tartrate (LOPRESSOR) 50 MG tablet    Sig: Take 1 tablet (50 mg total) by mouth as directed. Take 1 tablet 2 hours before CT scan    Dispense:  1 tablet    Refill:  0   Patient Instructions  Medication Instructions:  The current medical regimen is effective;  continue present plan and medications.  *If you need a refill on your cardiac medications before your next appointment, please call your pharmacy*   Lab Work: None today.  May need prior to CT scan once it is scheduled (BMP) If you have labs  (blood work) drawn today and your tests are completely normal, you will receive your results only by: MyChart Message (if you have MyChart) OR A paper copy  in the mail If you have any lab test that is abnormal or we need to change your treatment, we will call you to review the results.   Testing/Procedures: Your physician has requested that you have a carotid duplex. This test is an ultrasound of the carotid arteries in your neck. It looks at blood flow through these arteries that supply the brain with blood. Allow one hour for this exam. There are no restrictions or special instructions. This will be completed at our [facility] office (2nd floor).    Your cardiac CT will be scheduled at:   [name] [address] [place], [place] [address] [phone]  Pease arrive at the [name] and Children's Entrance (Entrance C2) of [facility] 30 minutes prior to test start time. You can use the FREE valet parking offered at entrance C (encouraged to control the heart rate for the test)  Proceed to the [name] Radiology Department (first floor) to check-in and test prep.  All radiology patients and guests should use entrance C2 at [facility], accessed from [name], even though the hospital's physical address listed is [address]    Please follow these instructions carefully (unless otherwise directed):  Hold all erectile dysfunction medications at least 3 days (72 hrs) prior to test. (Ie viagra, cialis, sildenafil, tadalafil, etc) We will administer nitroglycerin during this exam.   On the Night Before the Test: Be sure to Drink plenty of water. Do not consume any caffeinated/decaffeinated beverages or chocolate 12 hours prior to your test. Do not take any antihistamines 12 hours prior to your test.  On the Day of the Test: Drink plenty of water until 1 hour prior to the test. Do not eat any food 1 hour prior to  test. You may take your regular medications prior to the test.  Take metoprolol (Lopressor) two hours prior to test. HOLD Furosemide/Hydrochlorothiazide morning of the test. FEMALES- please wear underwire-free bra if available, avoid dresses & tight clothing      After the Test: Drink plenty of water. After receiving IV contrast, you may experience a mild flushed feeling. This is normal. On occasion, you may experience a mild rash up to 24 hours after the test. This is not dangerous. If this occurs, you can take Benadryl 25 mg and increase your fluid intake. If you experience trouble breathing, this can be serious. If it is severe call 911 IMMEDIATELY. If it is mild, please call our office. If you take any of these medications: Glipizide/Metformin, Avandament, Glucavance, please do not take 48 hours after completing test unless otherwise instructed.  We will call to schedule your test 2-4 weeks out understanding that some insurance companies will need an authorization prior to the service being performed.   For non-scheduling related questions, please contact the cardiac imaging nurse navigator should you have any questions/concerns: Rockwell Alexandria, Cardiac Imaging Nurse Navigator Larey Brick, Cardiac Imaging Nurse Navigator [facility] Direct Office Dial: [phone]   For scheduling needs, including cancellations and rescheduling, please call [place], [phone].  Follow-Up: At [facility], you and your health needs are our priority.  As part of our continuing mission to provide you with exceptional heart care, we have created designated Provider Care Teams.  These Care Teams include your primary Cardiologist (physician) and Advanced Practice Providers (APPs -  Physician Assistants and Nurse Practitioners) who all work together to provide you with the care you need, when  you need it.  We recommend signing up for the patient portal called  "MyChart".  Sign up information is provided on this After Visit Summary.  MyChart is used to connect with patients for Virtual Visits (Telemedicine).  Patients are able to view lab/test results, encounter notes, upcoming appointments, etc.  Non-urgent messages can be sent to your provider as well.   To learn more about what you can do with MyChart, go to [email or web address].    Your next appointment:   Follow up will be based on the results of the above testing.   Important Information About Sugar         I,Mathew Stumpf,acting as a scribe for Candee Furbish, MD.,have documented all relevant documentation on the behalf of Candee Furbish, MD,as directed by  Candee Furbish, MD while in the presence of Candee Furbish, MD.  I, Candee Furbish, MD, have reviewed all documentation for this visit. The documentation on 10/19/22 for the exam, diagnosis, procedures, and orders are all accurate and complete.   Signed, Candee Furbish, MD  10/19/2022 9:37 AM    [facility]

## 2022-10-19 ENCOUNTER — Encounter: Payer: Self-pay | Admitting: Cardiology

## 2022-10-19 ENCOUNTER — Ambulatory Visit: Payer: BC Managed Care – PPO | Attending: Cardiology | Admitting: Cardiology

## 2022-10-19 VITALS — BP 120/70 | HR 64 | Ht 62.0 in | Wt 138.0 lb

## 2022-10-19 DIAGNOSIS — E7800 Pure hypercholesterolemia, unspecified: Secondary | ICD-10-CM

## 2022-10-19 DIAGNOSIS — Z01812 Encounter for preprocedural laboratory examination: Secondary | ICD-10-CM

## 2022-10-19 DIAGNOSIS — M542 Cervicalgia: Secondary | ICD-10-CM

## 2022-10-19 DIAGNOSIS — R072 Precordial pain: Secondary | ICD-10-CM

## 2022-10-19 DIAGNOSIS — E119 Type 2 diabetes mellitus without complications: Secondary | ICD-10-CM | POA: Diagnosis not present

## 2022-10-19 DIAGNOSIS — I10 Essential (primary) hypertension: Secondary | ICD-10-CM

## 2022-10-19 MED ORDER — METOPROLOL TARTRATE 50 MG PO TABS: 50.0000 mg | ORAL_TABLET | ORAL | 0 refills | Status: DC

## 2022-10-19 NOTE — Patient Instructions (Signed)
Medication Instructions:  The current medical regimen is effective;  continue present plan and medications.  *If you need a refill on your cardiac medications before your next appointment, please call your pharmacy*   Lab Work: None today.  May need prior to CT scan once it is scheduled (BMP) If you have labs (blood work) drawn today and your tests are completely normal, you will receive your results only by: [place] (if you have MyChart) OR A paper copy in the mail If you have any lab test that is abnormal or we need to change your treatment, we will call you to review the results.   Testing/Procedures: Your physician has requested that you have a carotid duplex. This test is an ultrasound of the carotid arteries in your neck. It looks at blood flow through these arteries that supply the brain with blood. Allow one hour for this exam. There are no restrictions or special instructions. This will be completed at our [facility] office (2nd floor).    Your cardiac CT will be scheduled at:   [facility] [address] [place], [place] [address] [phone]  Pease arrive at the [facility] and Children's Entrance (Entrance C2) of [facility] 30 minutes prior to test start time. You can use the FREE valet parking offered at entrance C (encouraged to control the heart rate for the test)  Proceed to the [facility] Radiology Department (first floor) to check-in and test prep.  All radiology patients and guests should use entrance C2 at [facility], accessed from [facility], even though the hospital's physical address listed is [address].    Please follow these instructions carefully (unless otherwise directed):  Hold all erectile dysfunction medications at least 3 days (72 hrs) prior to test. (Ie viagra, cialis, sildenafil, tadalafil, etc) We will administer nitroglycerin during this exam.   On the Night Before the  Test: Be sure to Drink plenty of water. Do not consume any caffeinated/decaffeinated beverages or chocolate 12 hours prior to your test. Do not take any antihistamines 12 hours prior to your test.  On the Day of the Test: Drink plenty of water until 1 hour prior to the test. Do not eat any food 1 hour prior to test. You may take your regular medications prior to the test.  Take metoprolol (Lopressor) two hours prior to test. HOLD Furosemide/Hydrochlorothiazide morning of the test. FEMALES- please wear underwire-free bra if available, avoid dresses & tight clothing      After the Test: Drink plenty of water. After receiving IV contrast, you may experience a mild flushed feeling. This is normal. On occasion, you may experience a mild rash up to 24 hours after the test. This is not dangerous. If this occurs, you can take Benadryl 25 mg and increase your fluid intake. If you experience trouble breathing, this can be serious. If it is severe call 911 IMMEDIATELY. If it is mild, please call our office. If you take any of these medications: Glipizide/Metformin, Avandament, Glucavance, please do not take 48 hours after completing test unless otherwise instructed.  We will call to schedule your test 2-4 weeks out understanding that some insurance companies will need an authorization prior to the service being performed.   For non-scheduling related questions, please contact the cardiac imaging nurse navigator should you have any questions/concerns: Marchia Bond, Cardiac Imaging Nurse Navigator Gordy Clement, Cardiac Imaging Nurse Navigator [facility] Direct Office Dial: [phone]  For scheduling needs, including cancellations and rescheduling, please call [place], [phone].  Follow-Up: At [facility], you and your health needs are our priority.  As part of our continuing mission to provide you with exceptional heart care, we have created  designated Provider Care Teams.  These Care Teams include your primary Cardiologist (physician) and Advanced Practice Providers (APPs -  Physician Assistants and Nurse Practitioners) who all work together to provide you with the care you need, when you need it.  We recommend signing up for the patient portal called "MyChart".  Sign up information is provided on this After Visit Summary.  MyChart is used to connect with patients for Virtual Visits (Telemedicine).  Patients are able to view lab/test results, encounter notes, upcoming appointments, etc.  Non-urgent messages can be sent to your provider as well.   To learn more about what you can do with MyChart, go to [email or web address].    Your next appointment:   Follow up will be based on the results of the above testing.   Important Information About Sugar

## 2022-10-20 ENCOUNTER — Telehealth: Payer: Self-pay | Admitting: Internal Medicine

## 2022-10-20 NOTE — Telephone Encounter (Signed)
Pt called, returning CMA's call. CMA was unavailable. Pt asked that CMA call back at her earliest convenience. 

## 2022-10-22 NOTE — Telephone Encounter (Signed)
Spoke to patient today regards to lab result.

## 2022-10-26 LAB — HM DIABETES EYE EXAM

## 2022-10-31 NOTE — Progress Notes (Signed)
ok 

## 2022-12-07 ENCOUNTER — Ambulatory Visit: Payer: BC Managed Care – PPO | Admitting: Internal Medicine

## 2022-12-11 ENCOUNTER — Encounter (HOSPITAL_COMMUNITY): Payer: Self-pay

## 2022-12-25 ENCOUNTER — Other Ambulatory Visit: Payer: Self-pay | Admitting: Internal Medicine

## 2022-12-27 NOTE — Telephone Encounter (Addendum)
*  Last refill-12/09/20-180 tabs, 3 refills Last OV-10/13/22  Spoke with patient, currently takes med daily Okay to send refills?

## 2022-12-28 ENCOUNTER — Encounter: Payer: BC Managed Care – PPO | Admitting: Internal Medicine

## 2023-01-20 ENCOUNTER — Encounter: Payer: Self-pay | Admitting: Internal Medicine

## 2023-01-24 ENCOUNTER — Other Ambulatory Visit: Payer: Self-pay | Admitting: Family

## 2023-02-28 NOTE — Progress Notes (Signed)
Chief Complaint  Patient presents with   Annual Exam    HPI: Patient  Amber [name]  57 y.o. 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  Still on lisinopril 30 mg   Since on farxiga.. so doing this  Feels quite well  flarxiga has helped her so much  with evergy and exercise tolerance.  Still on janumet also  HLD  pravastatin  nose  Worried about ancer possibilities   family member child  with neuroblastoma  Says gets nausea  and diarrhea she says predates diabetes medication   and not the janumet  not sure  weight stable worried about pancreas Health Maintenance  Topic Date Due   COVID-19 Vaccine (5 - 2023-24 season) 08/20/2022   MAMMOGRAM  10/07/2022   INFLUENZA VACCINE  03/20/2023 (Originally 07/20/2022)   COLONOSCOPY (Pts 45-12yrs Insurance coverage will need to be confirmed)  06/01/2023 (Originally 02/07/2011)   Zoster Vaccines- Shingrix (1 of 2) 06/01/2023 (Originally 02/07/1985)   HIV Screening  02/29/2024 (Originally 02/07/1981)   DTaP/Tdap/Td (3 - Td or Tdap) 06/05/2023   HEMOGLOBIN A1C  09/01/2023   OPHTHALMOLOGY EXAM  10/27/2023   PAP SMEAR-Modifier  12/10/2023   Diabetic kidney evaluation - eGFR measurement  02/29/2024   Diabetic kidney evaluation - Urine ACR  02/29/2024   FOOT EXAM  02/29/2024   Hepatitis C Screening  Completed   HPV VACCINES  Aged Out   Health Maintenance Review LIFESTYLE:  Exercise:   too much  Tobacco/ETS: no Alcohol:  no Sugar beverages: no Sleep: 8 hours  Drug use: no HH of  3  no pets  Work:  15 hours per week.     ROS:  GEN/ HEENT: No fever, significant weight changes sweats headaches vision problems hearing changes, CV/ PULM; No chest pain shortness of breath cough, syncope,edema  change in exercise tolerance. GI /GU: No adominal pain, vomiting, change in bowel habits. No blood  in the stool. No significant GU symptoms. SKIN/HEME: ,no acute skin rashes suspicious lesions or bleeding. No lymphadenopathy, nodules, masses.  NEURO/ PSYCH:  No neurologic signs such as weakness numbness. No depression anxiety. IMM/ Allergy: No unusual infections.  Allergy .   REST of 12 system review negative except as per HPI   Past Medical History:  Diagnosis Date   Anemia    Diabetes mellitus    Hyperlipidemia    Hypertension     Past Surgical History:  Procedure Laterality Date   ablastion  09/2011   blood trans  08/2011   DILATION AND CURETTAGE OF UTERUS  09/2011   NO PAST SURGERIES      Family History  Problem Relation Age of Onset   Hypertension Mother    CAD Mother    Diabetes Father    CAD Father    Healthy Brother    Stroke Maternal Grandmother    Hypertension Maternal Grandmother    Diabetes Paternal Grandmother    Kidney failure Paternal Grandmother    Developmental delay Daughter    Diabetes Daughter    Diabetes Other        Grandparent   Stroke Other        Grandparent   Hyperlipidemia Other        parents    Social History  Socioeconomic History   Marital status: Married    Spouse name: Not on file   Number of children: 1   Years of education: BS   Highest education level: Not on file  Occupational History   Occupation: Acupuncturist  Tobacco Use   Smoking status: Never   Smokeless tobacco: Never  Vaping Use   Vaping Use: Never used  Substance and Sexual Activity   Alcohol use: No   Drug use: No   Sexual activity: Yes    Birth control/protection: Surgical  Other Topics Concern   Not on file  Social History Narrative   Married    BS degree   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    G1P1 child has developmental disability  Poss genetically based eval at [facility]   hh of 3  34 hours per week    Neg etsFA   [place] last eye exam .    Social Determinants of Health   Financial Resource Strain: Not on file  Food Insecurity: Not on  file  Transportation Needs: Not on file  Physical Activity: Not on file  Stress: Not on file  Social Connections: Not on file    Outpatient Medications Prior to Visit  Medication Sig Dispense Refill   Coenzyme Q10 (CO Q 10 PO) Take 1 tablet by mouth daily.     dapagliflozin propanediol (FARXIGA) 10 MG TABS tablet Take 1 tablet (10 mg total) by mouth daily before breakfast. Dosage change 30 tablet 6   hydrochlorothiazide (HYDRODIURIL) 25 MG tablet Take 25 mg by mouth daily.     JANUMET XR 50-1000 MG TB24 TAKE TWO TABLETS BY MOUTH DAILY 60 tablet 0   lisinopril (ZESTRIL) 30 MG tablet Take 30 mg by mouth daily.     pravastatin (PRAVACHOL) 20 MG tablet Take 1 tablet (20 mg total) by mouth daily. 90 tablet 3   vitamin B-12 (CYANOCOBALAMIN) 100 MCG tablet Take 100 mcg by mouth daily.     metoprolol tartrate (LOPRESSOR) 50 MG tablet Take 1 tablet (50 mg total) by mouth as directed. Take 1 tablet 2 hours before CT scan (Patient not taking: Reported on 03/01/2023) 1 tablet 0   No facility-administered medications prior to visit.     EXAM:  BP 120/80 (BP Location: Right Arm, Cuff Size: Normal)   Pulse 62   Temp 97.6 F (36.4 C) (Oral)   Ht 5' 2.9" (1.598 m)   Wt 140 lb 3.2 oz (63.6 kg)   SpO2 99%   BMI 24.91 kg/m   Body mass index is 24.91 kg/m. Wt Readings from Last 3 Encounters:  03/01/23 140 lb 3.2 oz (63.6 kg)  10/19/22 138 lb (62.6 kg)  10/13/22 137 lb 9.6 oz (62.4 kg)    Physical Exam: Vital signs reviewed [id number] is a well-developed well-nourished alert cooperative    who appearsr stated age in no acute distress.  HEENT: normocephalic atraumatic , Eyes: PERRL EOM's full, conjunctiva clear, Nares: paten,t no deformity discharge or tenderness., Ears: no deformity EAC's clear TMs with normal landmarks. Mouth: clear OP, no lesions, edema.  Moist mucous membranes. Dentition in adequate repair. NECK: supple without masses, thyromegaly or bruits. CHEST/PULM:  Clear to  auscultation and percussion breath sounds equal no wheeze , rales or rhonchi. No chest wall deformities or tenderness. Breast: normal by inspection . No dimpling, discharge, masses, tenderness or discharge . CV: PMI is nondisplaced, S1 S2 no gallops, murmurs, rubs. Peripheral pulses are full without delay.No JVD .  ABDOMEN: Bowel sounds normal  nontender  No guard or rebound, no hepato splenomegal no CVA tenderness.   Extremtities:  No clubbing cyanosis or edema, no acute joint swelling or redness no focal atrophy NEURO:  Oriented x3, cranial nerves 3-12 appear to be intact, no obvious focal weakness,gait within normal limits no abnormal reflexes or asymmetrical SKIN: No acute rashes normal turgor, color, no bruising or petechiae. PSYCH: Oriented, good eye contact, no obvious depression anxiety, cognition and judgment appear normal. LN: no cervical axillary inguinal adenopathy Diabetic Foot Exam - Simple   Simple Foot Form Diabetic Foot exam was performed with the following findings: Yes 03/01/2023  9:55 AM  Visual Inspection No deformities, no ulcerations, no other skin breakdown bilaterally: Yes Sensation Testing Intact to touch and monofilament testing bilaterally: Yes Pulse Check Posterior Tibialis and Dorsalis pulse intact bilaterally: Yes Comments     Lab Results  Component Value Date   WBC 5.6 03/01/2023   HGB 14.5 03/01/2023   HCT 43.3 03/01/2023   PLT 228.0 03/01/2023   GLUCOSE 111 (H) 03/01/2023   CHOL 187 03/01/2023   TRIG 139.0 03/01/2023   HDL 68.70 03/01/2023   LDLDIRECT 119.0 10/13/2022   LDLCALC 90 03/01/2023   ALT 13 03/01/2023   AST 15 03/01/2023   NA 138 03/01/2023   K 4.1 03/01/2023   CL 102 03/01/2023   CREATININE 0.86 03/01/2023   BUN 22 03/01/2023   CO2 26 03/01/2023   TSH 1.68 10/13/2022   INR 1.03 09/12/2011   HGBA1C 6.2 03/01/2023   MICROALBUR 9.6 (H) 03/01/2023    BP Readings from Last 3 Encounters:  03/01/23 120/80  10/19/22 120/70   10/13/22 120/80    Lab plan reviewed with patient   ASSESSMENT AND PLAN:  Discussed the following assessment and plan:    ICD-10-CM   1. Visit for preventive health examination  Z00.00 Hemoglobin A1c    Lipid panel    Microalbumin / creatinine urine ratio    Comprehensive metabolic panel    Vitamin B12    CBC with Differential/Platelet    2. PCOS (polycystic ovarian syndrome)  E28.2 Hemoglobin A1c    Lipid panel    Microalbumin / creatinine urine ratio    Comprehensive metabolic panel    Vitamin B12    CBC with Differential/Platelet    3. Pure hypercholesterolemia  E78.00 Hemoglobin A1c    Lipid panel    Microalbumin / creatinine urine ratio    Comprehensive metabolic panel    Vitamin B12    CBC with Differential/Platelet    4. Medication management  Z79.899 Hemoglobin A1c    Lipid panel    Microalbumin / creatinine urine ratio    Comprehensive metabolic panel    Vitamin B12    CBC with Differential/Platelet    5. Essential hypertension  I10 Hemoglobin A1c    Lipid panel    Microalbumin / creatinine urine ratio    Comprehensive metabolic panel    Vitamin B12    CBC with Differential/Platelet    6. Family history of renal failure  Z84.1 Hemoglobin A1c    Lipid panel    Microalbumin / creatinine urine ratio    Comprehensive metabolic panel    Vitamin B12    CBC with Differential/Platelet    7. Hepatic steatosis  K76.0 Hemoglobin A1c    Lipid panel    Microalbumin / creatinine urine ratio    Comprehensive metabolic panel    Vitamin B12    CBC with Differential/Platelet    8. Diabetes mellitus  with complication in adult patient ([place])  E11.8 Hemoglobin A1c    Lipid panel    Microalbumin / creatinine urine ratio    Comprehensive metabolic panel    Vitamin B12    CBC with Differential/Platelet    9. Nausea  R11.0 Hemoglobin A1c    Lipid panel    Microalbumin / creatinine urine ratio    Comprehensive metabolic panel    Vitamin B12    CBC with  Differential/Platelet    10. GI symptoms  R19.8    nausea diarrhe  meds vs dm vs other. seh says predates medication    Proteinuria lipid A1c urine protein due  check today  Not sure  she needs to be on janumet and  farxiga .  ( Sitagliptin component but doing  quite well.)  Can stop the hcz and try low dose amlodipine.  See Gi team  Return for depending on results 3 mos   bp meds etc .  Patient Care Team: Shalika Arntz, Standley Brooking, MD as PCP - General (Internal Medicine) Patient Instructions  I agree stopping the hctz and stayin gon the farxiga   But will need to add a different medication to help control BP.  We can add amlodiine low dose . Lab today   I advise e you discuss with the Gi team the diarrhea nausea sx  uncertain how much is DM , medication or other conditions.   In interim  will see what lab is  to consider  decreasing the metformin   to plain 500 per day .  dc the statagliptin as not sure that helpful   Get your mammogram as planned  and colonoscopy etc .   Standley Brooking. Wafa Martes M.D.

## 2023-03-01 ENCOUNTER — Encounter: Payer: Self-pay | Admitting: Internal Medicine

## 2023-03-01 ENCOUNTER — Ambulatory Visit (INDEPENDENT_AMBULATORY_CARE_PROVIDER_SITE_OTHER): Payer: BC Managed Care – PPO | Admitting: Internal Medicine

## 2023-03-01 VITALS — BP 120/80 | HR 62 | Temp 97.6°F | Ht 62.9 in | Wt 140.2 lb

## 2023-03-01 DIAGNOSIS — E282 Polycystic ovarian syndrome: Secondary | ICD-10-CM

## 2023-03-01 DIAGNOSIS — Z0000 Encounter for general adult medical examination without abnormal findings: Secondary | ICD-10-CM

## 2023-03-01 DIAGNOSIS — R110 Nausea: Secondary | ICD-10-CM

## 2023-03-01 DIAGNOSIS — R198 Other specified symptoms and signs involving the digestive system and abdomen: Secondary | ICD-10-CM

## 2023-03-01 DIAGNOSIS — Z8419 Family history of other disorders of kidney and ureter: Secondary | ICD-10-CM

## 2023-03-01 DIAGNOSIS — I10 Essential (primary) hypertension: Secondary | ICD-10-CM | POA: Diagnosis not present

## 2023-03-01 DIAGNOSIS — E7800 Pure hypercholesterolemia, unspecified: Secondary | ICD-10-CM | POA: Diagnosis not present

## 2023-03-01 DIAGNOSIS — Z841 Family history of disorders of kidney and ureter: Secondary | ICD-10-CM

## 2023-03-01 DIAGNOSIS — Z79899 Other long term (current) drug therapy: Secondary | ICD-10-CM | POA: Diagnosis not present

## 2023-03-01 DIAGNOSIS — E118 Type 2 diabetes mellitus with unspecified complications: Secondary | ICD-10-CM

## 2023-03-01 DIAGNOSIS — K760 Fatty (change of) liver, not elsewhere classified: Secondary | ICD-10-CM

## 2023-03-01 LAB — CBC WITH DIFFERENTIAL/PLATELET
Basophils Absolute: 0 10*3/uL (ref 0.0–0.1)
Basophils Relative: 0.7 % (ref 0.0–3.0)
Eosinophils Absolute: 0.1 10*3/uL (ref 0.0–0.7)
Eosinophils Relative: 1.4 % (ref 0.0–5.0)
HCT: 43.3 % (ref 36.0–46.0)
Hemoglobin: 14.5 g/dL (ref 12.0–15.0)
Lymphocytes Relative: 37.2 % (ref 12.0–46.0)
Lymphs Abs: 2.1 10*3/uL (ref 0.7–4.0)
MCHC: 33.5 g/dL (ref 30.0–36.0)
MCV: 92.6 fl (ref 78.0–100.0)
Monocytes Absolute: 0.3 10*3/uL (ref 0.1–1.0)
Monocytes Relative: 5 % (ref 3.0–12.0)
Neutro Abs: 3.1 10*3/uL (ref 1.4–7.7)
Neutrophils Relative %: 55.7 % (ref 43.0–77.0)
Platelets: 228 10*3/uL (ref 150.0–400.0)
RBC: 4.67 Mil/uL (ref 3.87–5.11)
RDW: 14.1 % (ref 11.5–15.5)
WBC: 5.6 10*3/uL (ref 4.0–10.5)

## 2023-03-01 LAB — LIPID PANEL
Cholesterol: 187 mg/dL (ref 0–200)
HDL: 68.7 mg/dL (ref >39.00)
LDL Cholesterol: 90 mg/dL (ref 0–99)
NonHDL: 118.18
Total CHOL/HDL Ratio: 3
Triglycerides: 139 mg/dL (ref 0.0–149.0)
VLDL: 27.8 mg/dL (ref 0.0–40.0)

## 2023-03-01 LAB — COMPREHENSIVE METABOLIC PANEL
ALT: 13 U/L (ref 0–35)
AST: 15 U/L (ref 0–37)
Albumin: 4.2 g/dL (ref 3.5–5.2)
Alkaline Phosphatase: 62 U/L (ref 39–117)
BUN: 22 mg/dL (ref 6–23)
CO2: 26 mEq/L (ref 19–32)
Calcium: 9.6 mg/dL (ref 8.4–10.5)
Chloride: 102 mEq/L (ref 96–112)
Creatinine, Ser: 0.86 mg/dL (ref 0.40–1.20)
GFR: 75.18 mL/min (ref >60.00)
Glucose, Bld: 111 mg/dL — ABNORMAL HIGH (ref 70–99)
Potassium: 4.1 mEq/L (ref 3.5–5.1)
Sodium: 138 mEq/L (ref 135–145)
Total Bilirubin: 0.6 mg/dL (ref 0.2–1.2)
Total Protein: 7.1 g/dL (ref 6.0–8.3)

## 2023-03-01 LAB — MICROALBUMIN / CREATININE URINE RATIO
Creatinine,U: 51.6 mg/dL
Microalb Creat Ratio: 18.7 mg/g (ref 0.0–30.0)
Microalb, Ur: 9.6 mg/dL — ABNORMAL HIGH (ref 0.0–1.9)

## 2023-03-01 LAB — VITAMIN B12: Vitamin B-12: 888 pg/mL (ref 211–911)

## 2023-03-01 LAB — HEMOGLOBIN A1C: Hgb A1c MFr Bld: 6.2 % (ref 4.6–6.5)

## 2023-03-01 MED ORDER — AMLODIPINE BESYLATE 2.5 MG PO TABS: 2.5000 mg | ORAL_TABLET | Freq: Every day | ORAL | 3 refills | Status: DC

## 2023-03-01 NOTE — Patient Instructions (Addendum)
I agree stopping the hctz and stayin gon the farxiga   But will need to add a different medication to help control BP.  We can add amlodiine low dose . Lab today   I advise e you discuss with the Gi team the diarrhea nausea sx  uncertain how much is DM , medication or other conditions.   In interim  will see what lab is  to consider  decreasing the metformin   to plain 500 per day .  dc the statagliptin as not sure that helpful   Get your mammogram as planned  and colonoscopy etc .

## 2023-03-07 NOTE — Progress Notes (Signed)
All labs look much better    at goal . No protein in urine   Let us know if change in bp medication doing ok.  Otherwise rov in 6 months

## 2023-03-10 ENCOUNTER — Telehealth: Payer: Self-pay | Admitting: Internal Medicine

## 2023-03-10 MED ORDER — JANUMET XR 50-1000 MG PO TB24: 2.0000 | ORAL_TABLET | Freq: Every day | ORAL | 0 refills | Status: DC

## 2023-03-10 NOTE — Telephone Encounter (Signed)
Prescription Request  03/10/2023  LOV: 03/01/2023  What is the name of the medication or equipment?  JANUMET XR 50-1000 MG TB24  Pt states she is completely out of this medication.  Have you contacted your pharmacy to request a refill? Yes   Which pharmacy would you like this sent to?   [facility] # [place], [place] Phone: [phone]  Fax: [phone]      Patient notified that their request is being sent to the clinical staff for review and that they should receive a response within 2 business days.   Please advise at Mobile [phone] (mobile)

## 2023-03-10 NOTE — Telephone Encounter (Signed)
Rx sent 

## 2023-04-07 ENCOUNTER — Other Ambulatory Visit: Payer: Self-pay | Admitting: Internal Medicine

## 2023-05-16 ENCOUNTER — Other Ambulatory Visit: Payer: Self-pay | Admitting: Internal Medicine

## 2023-06-05 ENCOUNTER — Other Ambulatory Visit: Payer: Self-pay | Admitting: Internal Medicine

## 2023-06-10 ENCOUNTER — Other Ambulatory Visit: Payer: Self-pay | Admitting: Family

## 2023-07-05 ENCOUNTER — Other Ambulatory Visit: Payer: Self-pay | Admitting: Family

## 2023-08-10 ENCOUNTER — Telehealth: Payer: Self-pay

## 2023-08-10 ENCOUNTER — Other Ambulatory Visit: Payer: Self-pay

## 2023-08-10 MED ORDER — JANUMET XR 50-1000 MG PO TB24: 2.0000 | ORAL_TABLET | Freq: Every day | ORAL | 0 refills | Status: DC

## 2023-08-10 NOTE — Telephone Encounter (Signed)
Attempted to reach pt regarding to 6 months follow up from [facility] in 02/2023. Left a voicemail to contact us back.

## 2023-09-06 ENCOUNTER — Other Ambulatory Visit: Payer: Self-pay | Admitting: Internal Medicine

## 2023-10-03 ENCOUNTER — Other Ambulatory Visit: Payer: Self-pay | Admitting: Internal Medicine

## 2023-10-06 ENCOUNTER — Other Ambulatory Visit: Payer: Self-pay | Admitting: Internal Medicine

## 2023-10-06 ENCOUNTER — Ambulatory Visit: Payer: BC Managed Care – PPO | Admitting: Internal Medicine

## 2023-10-06 VITALS — BP 130/88 | HR 60 | Temp 97.8°F | Ht 62.9 in | Wt 140.4 lb

## 2023-10-06 DIAGNOSIS — E1165 Type 2 diabetes mellitus with hyperglycemia: Secondary | ICD-10-CM | POA: Diagnosis not present

## 2023-10-06 DIAGNOSIS — E7800 Pure hypercholesterolemia, unspecified: Secondary | ICD-10-CM | POA: Diagnosis not present

## 2023-10-06 DIAGNOSIS — E282 Polycystic ovarian syndrome: Secondary | ICD-10-CM

## 2023-10-06 DIAGNOSIS — Z79899 Other long term (current) drug therapy: Secondary | ICD-10-CM | POA: Diagnosis not present

## 2023-10-06 DIAGNOSIS — I10 Essential (primary) hypertension: Secondary | ICD-10-CM

## 2023-10-06 DIAGNOSIS — Z7984 Long term (current) use of oral hypoglycemic drugs: Secondary | ICD-10-CM | POA: Diagnosis not present

## 2023-10-06 DIAGNOSIS — Z841 Family history of disorders of kidney and ureter: Secondary | ICD-10-CM

## 2023-10-06 DIAGNOSIS — Z0000 Encounter for general adult medical examination without abnormal findings: Secondary | ICD-10-CM

## 2023-10-06 LAB — POCT GLYCOSYLATED HEMOGLOBIN (HGB A1C): Hemoglobin A1C: 6.6 % — AB (ref 4.0–5.6)

## 2023-10-06 MED ORDER — AMLODIPINE BESYLATE 5 MG PO TABS: 5.0000 mg | ORAL_TABLET | Freq: Every day | ORAL | 1 refills | Status: DC

## 2023-10-06 NOTE — Patient Instructions (Addendum)
Lets  increase the amlodipine to 5 mg per day and continue on the  lisinopril medication.for BP control ( Stop  the hydrochlorothiazide for now)   Goal BP is average 130/80 and below/ A1c is in controlled range.  Yes want to avoid dehydration.   Hg A1c is 6.6 still in a controlled range  Doubt if  the statin is causing the heel pain . But follow.  PLan lab  before next visit prefer fasting   ( I will place the orders. )

## 2023-10-06 NOTE — Progress Notes (Signed)
Chief Complaint  Patient presents with   Medical Management of Chronic Issues    Pt reports she is not taking amlodipine for BP. It did not works for her.     HPI: Amber [name] 57 y.o. come in for Chronic disease management   BP 2.5 amlo didn't help her bp so taking hydrochlorothiazide a few days a week . Renal doc discouraged use of reg hydrochlorothiazide diuretic   no swelling.  Still on lisinopril Exercising tennis . Amber [name] working well  ROS: See pertinent positives and negatives per HPI. Heels and achilles can hurt ? If statin but plays tennis Says can ge neck and eyes red ( nl exam) and alevel helps if takes ocass) Past Medical History:  Diagnosis Date   Anemia    Diabetes mellitus    Hyperlipidemia    Hypertension     Family History  Problem Relation Age of Onset   Hypertension Mother    CAD Mother    Diabetes Father    CAD Father    Healthy Brother    Stroke Maternal Grandmother    Hypertension Maternal Grandmother    Diabetes Paternal Grandmother    Kidney failure Paternal Grandmother    Developmental delay Daughter    Diabetes Daughter    Diabetes Other        Grandparent   Stroke Other        Grandparent   Hyperlipidemia Other        parents    Social History   Socioeconomic History   Marital status: Married    Spouse name: Not on file   Number of children: 1   Years of education: BS   Highest education level: Bachelor's degree (e.g., BA, AB, BS)  Occupational History   Occupation: Acupuncturist  Tobacco Use   Smoking status: Never   Smokeless tobacco: Never  Vaping Use   Vaping status: Never Used  Substance and Sexual Activity   Alcohol use: No   Drug use: No   Sexual activity: Yes    Birth control/protection: Surgical  Other Topics Concern   Not on file  Social History Narrative   Married    BS degree   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    G1P1 child has developmental disability  Poss genetically based eval at [facility]   hh of 3  34  hours per week    Neg etsFA   [place] last eye exam .    Social Determinants of Health   Financial Resource Strain: Low Risk  (10/06/2023)   Overall Financial Resource Strain (CARDIA)    Difficulty of Paying Living Expenses: Not very hard  Food Insecurity: No Food Insecurity (10/06/2023)   Hunger Vital Sign    Worried About Running Out of Food in the Last Year: Never true    Ran Out of Food in the Last Year: Never true  Transportation Needs: No Transportation Needs (10/06/2023)   PRAPARE - Administrator, Civil Service (Medical): No    Lack of Transportation (Non-Medical): No  Physical Activity: Sufficiently Active (10/06/2023)   Exercise Vital Sign    Days of Exercise per Week: 4 days    Minutes of Exercise per Session: 120 min  Stress: No Stress Concern Present (10/06/2023)   [employer] of Occupational Health - Occupational Stress Questionnaire    Feeling of Stress : Not at all  Social Connections: Unknown (10/06/2023)   Social Connection and Isolation Panel [NHANES]    Frequency of  Communication with Friends and Family: Patient declined    Frequency of Social Gatherings with Friends and Family: Patient declined    Attends Religious Services: Never    Database administrator or Organizations: No    Attends Engineer, structural: Not on file    Marital Status: Married    Outpatient Medications Prior to Visit  Medication Sig Dispense Refill   Coenzyme Q10 (CO Q 10 PO) Take 1 tablet by mouth daily.     dapagliflozin propanediol (FARXIGA) 10 MG TABS tablet take 1 tablet by mouth daily before breakfast 90 tablet 2   hydrochlorothiazide (HYDRODIURIL) 25 MG tablet Take 25 mg by mouth daily.     JANUMET XR 50-1000 MG TB24 TAKE TWO TABLETS BY MOUTH DAILY 60 tablet 0   lisinopril (ZESTRIL) 30 MG tablet Take 30 mg by mouth daily.     pravastatin (PRAVACHOL) 20 MG tablet Take 1 tablet (20 mg total) by mouth daily. 90 tablet 3   vitamin B-12 (CYANOCOBALAMIN) 100  MCG tablet Take 100 mcg by mouth daily.     metoprolol tartrate (LOPRESSOR) 50 MG tablet Take 1 tablet (50 mg total) by mouth as directed. Take 1 tablet 2 hours before CT scan (Patient not taking: Reported on 03/01/2023) 1 tablet 0   amLODipine (NORVASC) 2.5 MG tablet Take 1 tablet (2.5 mg total) by mouth daily. For high blood pressure (Patient not taking: Reported on 10/06/2023) 30 tablet 3   No facility-administered medications prior to visit.     EXAM:  BP 130/88 (BP Location: Left Arm, Patient Position: Sitting, Cuff Size: Normal)   Pulse 60   Temp 97.8 F (36.6 C) (Oral)   Ht 5' 2.9" (1.598 m)   Wt 140 lb 6.4 oz (63.7 kg)   SpO2 99%   BMI 24.95 kg/m   Body mass index is 24.95 kg/m.  GENERAL: vitals reviewed and listed above, alert, oriented, appears well hydrated and in no acute distress HEENT: atraumatic, conjunctiva  clear, no obvious abnormalities on inspection of external nose and ears NECK: no obvious masses on inspection palpation  LUNGS: clear to auscultation bilaterally, no wheezes, rales or rhonchi, good air movement CV: HRRR, no clubbing cyanosis or  peripheral edema nl cap refill  MS: moves all extremities without noticeable focal  abnormality PSYCH: pleasant and cooperative, no obvious depression or anxiety Lab Results  Component Value Date   WBC 5.6 03/01/2023   HGB 14.5 03/01/2023   HCT 43.3 03/01/2023   PLT 228.0 03/01/2023   GLUCOSE 111 (H) 03/01/2023   CHOL 187 03/01/2023   TRIG 139.0 03/01/2023   HDL 68.70 03/01/2023   LDLDIRECT 119.0 10/13/2022   LDLCALC 90 03/01/2023   ALT 13 03/01/2023   AST 15 03/01/2023   NA 138 03/01/2023   K 4.1 03/01/2023   CL 102 03/01/2023   CREATININE 0.86 03/01/2023   BUN 22 03/01/2023   CO2 26 03/01/2023   TSH 1.68 10/13/2022   INR 1.03 09/12/2011   HGBA1C 6.6 (A) 10/06/2023   MICROALBUR 9.6 (H) 03/01/2023   BP Readings from Last 3 Encounters:  10/06/23 130/88  03/01/23 120/80  10/19/22 120/70     ASSESSMENT AND PLAN:  Discussed the following assessment and plan:  Controlled type 2 diabetes mellitus with hyperglycemia, without long-term current use of insulin (HCC) - trend up a bit - Plan: POC HgB A1c  Pure hypercholesterolemia  Medication management  Essential hypertension - need optimization So  only taking 3 days per week hydrochlorothiazide  cause of renal advice  Suggest that maybe amlodipine 5 mg would work better o BP and she can stop the hydrochlorothiazide( other option is to take lower dose) want to avoid hypoperfusion and metabolic changes ) Attention to dietary intake Doubt if heel pain is from the statin  -Patient advised to return or notify health care team  if  new concerns arise.  Patient Instructions  Lets  increase the amlodipine to 5 mg per day and continue on the  lisinopril medication.for BP control ( Stop  the hydrochlorothiazide for now)   Goal BP is average 130/80 and below/ A1c is in controlled range.  Yes want to avoid dehydration.   Hg A1c is 6.6 still in a controlled range  Doubt if  the statin is causing the heel pain . But follow.  PLan lab  before next visit prefer fasting   ( I will place the orders. )  Neta Mends. Zylpha Poynor M.D.

## 2023-11-06 ENCOUNTER — Other Ambulatory Visit: Payer: Self-pay | Admitting: Internal Medicine

## 2023-11-21 ENCOUNTER — Other Ambulatory Visit: Payer: Self-pay | Admitting: Internal Medicine

## 2023-12-04 ENCOUNTER — Other Ambulatory Visit: Payer: Self-pay | Admitting: Family

## 2024-02-15 ENCOUNTER — Other Ambulatory Visit: Payer: Self-pay | Admitting: Internal Medicine

## 2024-02-23 ENCOUNTER — Other Ambulatory Visit: Payer: BC Managed Care – PPO

## 2024-02-23 DIAGNOSIS — Z0000 Encounter for general adult medical examination without abnormal findings: Secondary | ICD-10-CM

## 2024-02-23 DIAGNOSIS — E7800 Pure hypercholesterolemia, unspecified: Secondary | ICD-10-CM | POA: Diagnosis not present

## 2024-02-23 DIAGNOSIS — I10 Essential (primary) hypertension: Secondary | ICD-10-CM | POA: Diagnosis not present

## 2024-02-23 DIAGNOSIS — E1165 Type 2 diabetes mellitus with hyperglycemia: Secondary | ICD-10-CM | POA: Diagnosis not present

## 2024-02-23 DIAGNOSIS — Z841 Family history of disorders of kidney and ureter: Secondary | ICD-10-CM

## 2024-02-23 DIAGNOSIS — Z79899 Other long term (current) drug therapy: Secondary | ICD-10-CM

## 2024-02-23 DIAGNOSIS — E282 Polycystic ovarian syndrome: Secondary | ICD-10-CM

## 2024-02-23 LAB — CBC WITH DIFFERENTIAL/PLATELET
Basophils Absolute: 0 K/uL (ref 0.0–0.1)
Basophils Relative: 0.8 % (ref 0.0–3.0)
Eosinophils Absolute: 0.2 K/uL (ref 0.0–0.7)
Eosinophils Relative: 2.9 % (ref 0.0–5.0)
HCT: 41.3 % (ref 36.0–46.0)
Hemoglobin: 14 g/dL (ref 12.0–15.0)
Lymphocytes Relative: 36.8 % (ref 12.0–46.0)
Lymphs Abs: 2.4 K/uL (ref 0.7–4.0)
MCHC: 33.9 g/dL (ref 30.0–36.0)
MCV: 91.7 fl (ref 78.0–100.0)
Monocytes Absolute: 0.4 K/uL (ref 0.1–1.0)
Monocytes Relative: 5.4 % (ref 3.0–12.0)
Neutro Abs: 3.5 K/uL (ref 1.4–7.7)
Neutrophils Relative %: 54.1 % (ref 43.0–77.0)
Platelets: 258 K/uL (ref 150.0–400.0)
RBC: 4.5 Mil/uL (ref 3.87–5.11)
RDW: 13.7 % (ref 11.5–15.5)
WBC: 6.5 K/uL (ref 4.0–10.5)

## 2024-02-23 LAB — HEPATIC FUNCTION PANEL
ALT: 16 U/L (ref 0–35)
AST: 14 U/L (ref 0–37)
Albumin: 4.6 g/dL (ref 3.5–5.2)
Alkaline Phosphatase: 66 U/L (ref 39–117)
Bilirubin, Direct: 0.1 mg/dL (ref 0.0–0.3)
Total Bilirubin: 0.4 mg/dL (ref 0.2–1.2)
Total Protein: 7.5 g/dL (ref 6.0–8.3)

## 2024-02-23 LAB — MICROALBUMIN / CREATININE URINE RATIO
Creatinine,U: 81.4 mg/dL
Microalb Creat Ratio: 449.5 mg/g — ABNORMAL HIGH (ref 0.0–30.0)
Microalb, Ur: 36.6 mg/dL — ABNORMAL HIGH (ref 0.0–1.9)

## 2024-02-23 LAB — BASIC METABOLIC PANEL
BUN: 15 mg/dL (ref 6–23)
CO2: 27 meq/L (ref 19–32)
Calcium: 9.7 mg/dL (ref 8.4–10.5)
Chloride: 101 meq/L (ref 96–112)
Creatinine, Ser: 0.76 mg/dL (ref 0.40–1.20)
GFR: 86.6 mL/min (ref >60.00)
Glucose, Bld: 140 mg/dL — ABNORMAL HIGH (ref 70–99)
Potassium: 4.2 meq/L (ref 3.5–5.1)
Sodium: 138 meq/L (ref 135–145)

## 2024-02-23 LAB — LIPID PANEL
Cholesterol: 192 mg/dL (ref 0–200)
HDL: 63.8 mg/dL (ref >39.00)
LDL Cholesterol: 62 mg/dL (ref 0–99)
NonHDL: 128.28
Total CHOL/HDL Ratio: 3
Triglycerides: 331 mg/dL — ABNORMAL HIGH (ref 0.0–149.0)
VLDL: 66.2 mg/dL — ABNORMAL HIGH (ref 0.0–40.0)

## 2024-02-23 LAB — TSH: TSH: 2.19 u[IU]/mL (ref 0.35–5.50)

## 2024-02-23 LAB — HEMOGLOBIN A1C: Hgb A1c MFr Bld: 6.6 % — ABNORMAL HIGH (ref 4.6–6.5)

## 2024-02-23 LAB — VITAMIN B12: Vitamin B-12: 552 pg/mL (ref 211–911)

## 2024-02-24 ENCOUNTER — Encounter: Payer: Self-pay | Admitting: Internal Medicine

## 2024-02-24 NOTE — Progress Notes (Signed)
 Will discuss  at upcoming visit   Dm stable  Urine protein elevated

## 2024-03-01 ENCOUNTER — Encounter: Payer: BC Managed Care – PPO | Admitting: Internal Medicine

## 2024-03-07 ENCOUNTER — Encounter: Payer: Self-pay | Admitting: Internal Medicine

## 2024-03-07 ENCOUNTER — Ambulatory Visit: Payer: BC Managed Care – PPO | Admitting: Internal Medicine

## 2024-03-07 VITALS — BP 100/66 | HR 71 | Temp 98.4°F | Ht 62.75 in | Wt 138.0 lb

## 2024-03-07 DIAGNOSIS — Z79899 Other long term (current) drug therapy: Secondary | ICD-10-CM | POA: Diagnosis not present

## 2024-03-07 DIAGNOSIS — E785 Hyperlipidemia, unspecified: Secondary | ICD-10-CM

## 2024-03-07 DIAGNOSIS — Z0000 Encounter for general adult medical examination without abnormal findings: Secondary | ICD-10-CM | POA: Diagnosis not present

## 2024-03-07 DIAGNOSIS — E118 Type 2 diabetes mellitus with unspecified complications: Secondary | ICD-10-CM | POA: Diagnosis not present

## 2024-03-07 DIAGNOSIS — I10 Essential (primary) hypertension: Secondary | ICD-10-CM | POA: Diagnosis not present

## 2024-03-07 DIAGNOSIS — E1121 Type 2 diabetes mellitus with diabetic nephropathy: Secondary | ICD-10-CM

## 2024-03-07 DIAGNOSIS — R0683 Snoring: Secondary | ICD-10-CM

## 2024-03-07 DIAGNOSIS — Z7984 Long term (current) use of oral hypoglycemic drugs: Secondary | ICD-10-CM

## 2024-03-07 DIAGNOSIS — Z841 Family history of disorders of kidney and ureter: Secondary | ICD-10-CM

## 2024-03-07 MED ORDER — AMLODIPINE BESYLATE 5 MG PO TABS: 5.0000 mg | ORAL_TABLET | Freq: Every day | ORAL | 1 refills | Status: DC

## 2024-03-07 MED ORDER — DAPAGLIFLOZIN PROPANEDIOL 5 MG PO TABS: 5.0000 mg | ORAL_TABLET | Freq: Every day | ORAL | 3 refills | Status: AC

## 2024-03-07 MED ORDER — JANUMET XR 50-1000 MG PO TB24: 2.0000 | ORAL_TABLET | Freq: Every day | ORAL | 1 refills | Status: AC

## 2024-03-07 NOTE — Patient Instructions (Addendum)
 So some of your symptoms could be from a dehydration state.  But farxiga is best for kidney  function help as well as diabetes .  Bp may be  low at times causing a symptoms.  Go back on farxiga 5 mg per day Can decrease janumet to 1 per day. Lisinopril to 20 mg per day Stop the hydrochlorothiazide  for now.   Stay hydrated   We can rewrite prescriptions if we decide to remain on the above doses.   Limit  advil for reasons discussed.   If sleep  interruption iscontinuing to be a problem consider seeing sleep specialist to check for sleep apnea that is common with diabetes.  If palpitations on going  or progressive we can order an echocardiogram  but  agree probably related to medication and hydration status.  Stay on pravastatin as the LDL is at goal .   Plan fu in 3 months or as needed ro readress all of the above

## 2024-03-07 NOTE — Progress Notes (Signed)
 Chief Complaint  Patient presents with   Annual Exam    Pt reports she discovered she is allergic gluten-stomach pain, diarrhea, vomitt, rash and itchy. Would like to discuss DM med and BP med.    HPI: Patient  Amber [name]  58 y.o. comes in today for Preventive Health Care visit   And med evaluation  HT  128/ range and good on lisin 30 taking hctx 3 x per week  and amlodipine.  Has had some  ? Se had some pounding heart  but no arryhtymia and ? If hydration was low Taking janumet bid  Stopped the farxiga  at least 3 weeks ago because of all sx and ? Feels better wonders if bp too low or other  Snores at night no hx of osa  but has does awaken in nihgt poss nocturia vs other.  HLD on pravastatin ask if can go off "since could cause tendon problem "  Health Maintenance  Topic Date Due   Colonoscopy  Never done   OPHTHALMOLOGY EXAM  10/27/2023   FOOT EXAM  02/29/2024   INFLUENZA VACCINE  03/19/2024 (Originally 07/21/2023)   COVID-19 Vaccine (5 - 2024-25 season) 03/23/2024 (Originally 08/21/2023)   Zoster Vaccines- Shingrix (1 of 2) 06/07/2024 (Originally 02/07/1985)   MAMMOGRAM  12/07/2024 (Originally 10/07/2022)   HIV Screening  12/07/2024 (Originally 02/07/1981)   DTaP/Tdap/Td (3 - Td or Tdap) 03/07/2025 (Originally 06/05/2023)   Pneumococcal Vaccine 28-57 Years old (1 of 2 - PCV) 03/07/2025 (Originally 02/08/1972)   HEMOGLOBIN A1C  08/25/2024   Diabetic kidney evaluation - eGFR measurement  02/22/2025   Diabetic kidney evaluation - Urine ACR  02/22/2025   Cervical Cancer Screening (HPV/Pap Cotest)  12/09/2025   Hepatitis C Screening  Completed   HPV VACCINES  Aged Out   Health Maintenance Review LIFESTYLE:  Exercise:   Tobacco/ETS:n Alcohol:   no Sugar beverages:   Sleep:  not still  wakes up tired  Drug use: no HH of  3 no pets      ROS:  see hopi no nueropathy  but at some point some kine of eye sx  no lov GEN/ HEENT: No fever, significant weight changes sweats headaches  vision problems hearing changes, CV/ PULM; No chest pain shortness of breath cough, syncope,edema  change in exercise tolerance. GI /GU: No adominal pain, vomiting, change in bowel habits. No blood in the stool. No significant GU symptoms. SKIN/HEME: ,no acute skin rashes suspicious lesions or bleeding. No lymphadenopathy, nodules, masses.  NEURO/ PSYCH:  No neurologic signs such as weakness numbness. No depression anxiety. IMM/ Allergy: No unusual infections.  Allergy .   REST of 12 system review negative except as per HPI   Past Medical History:  Diagnosis Date   Anemia    Diabetes mellitus    Hyperlipidemia    Hypertension     Past Surgical History:  Procedure Laterality Date   ablastion  09/2011   blood trans  08/2011   DILATION AND CURETTAGE OF UTERUS  09/2011   NO PAST SURGERIES      Family History  Problem Relation Age of Onset   Hypertension Mother    CAD Mother    Diabetes Father    CAD Father    Healthy Brother    Stroke Maternal Grandmother    Hypertension Maternal Grandmother    Diabetes Paternal Grandmother    Kidney failure Paternal Grandmother    Developmental delay Daughter    Diabetes Daughter  Diabetes Other        Grandparent   Stroke Other        Grandparent   Hyperlipidemia Other        parents    Social History   Socioeconomic History   Marital status: Married    Spouse name: Not on file   Number of children: 1   Years of education: BS   Highest education level: Bachelor's degree (e.g., BA, AB, BS)  Occupational History   Occupation: Acupuncturist  Tobacco Use   Smoking status: Never   Smokeless tobacco: Never  Vaping Use   Vaping status: Never Used  Substance and Sexual Activity   Alcohol use: No   Drug use: No   Sexual activity: Yes    Birth control/protection: Surgical  Other Topics Concern   Not on file  Social History Narrative   Married    BS degree   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    G1P1 child has  developmental disability  Poss genetically based eval at [facility]   hh of 3  34 hours per week    Neg etsFA   [place] last eye exam .    Social Drivers of Health   Financial Resource Strain: Low Risk  (10/06/2023)   Overall Financial Resource Strain (CARDIA)    Difficulty of Paying Living Expenses: Not very hard  Food Insecurity: No Food Insecurity (10/06/2023)   Hunger Vital Sign    Worried About Running Out of Food in the Last Year: Never true    Ran Out of Food in the Last Year: Never true  Transportation Needs: No Transportation Needs (10/06/2023)   PRAPARE - Administrator, Civil Service (Medical): No    Lack of Transportation (Non-Medical): No  Physical Activity: Sufficiently Active (10/06/2023)   Exercise Vital Sign    Days of Exercise per Week: 4 days    Minutes of Exercise per Session: 120 min  Stress: No Stress Concern Present (10/06/2023)   [employer] of Occupational Health - Occupational Stress Questionnaire    Feeling of Stress : Not at all  Social Connections: Unknown (10/06/2023)   Social Connection and Isolation Panel [NHANES]    Frequency of Communication with Friends and Family: Patient declined    Frequency of Social Gatherings with Friends and Family: Patient declined    Attends Religious Services: Never    Database administrator or Organizations: No    Attends Engineer, structural: Not on file    Marital Status: Married    Outpatient Medications Prior to Visit  Medication Sig Dispense Refill   Coenzyme Q10 (CO Q 10 PO) Take 1 tablet by mouth daily.     dapagliflozin propanediol (FARXIGA) 10 MG TABS tablet take 1 tablet by mouth daily before breakfast 90 tablet 2   hydrochlorothiazide (HYDRODIURIL) 25 MG tablet Take 25 mg by mouth daily.     lisinopril (ZESTRIL) 30 MG tablet Take 30 mg by mouth daily.     pravastatin (PRAVACHOL) 20 MG tablet TAKE ONE TABLET BY MOUTH ONCE A DAY 90 tablet 0   vitamin B-12 (CYANOCOBALAMIN) 100 MCG tablet  Take 100 mcg by mouth daily.     amLODipine (NORVASC) 5 MG tablet Take 1 tablet (5 mg total) by mouth daily. Increase dose 90 tablet 1   JANUMET XR 50-1000 MG TB24 TAKE TWO TABLETS BY MOUTH DAILY 60 tablet 0   No facility-administered medications prior to visit.     EXAM:  BP  100/66 (BP Location: Left Arm, Patient Position: Sitting, Cuff Size: Normal)   Pulse 71   Temp 98.4 F (36.9 C) (Oral)   Ht 5' 2.75" (1.594 m)   Wt 138 lb (62.6 kg)   SpO2 98%   BMI 24.64 kg/m   Body mass index is 24.64 kg/m. Wt Readings from Last 3 Encounters:  03/07/24 138 lb (62.6 kg)  10/06/23 140 lb 6.4 oz (63.7 kg)  03/01/23 140 lb 3.2 oz (63.6 kg)   BP Readings from Last 3 Encounters:  03/07/24 100/66  10/06/23 130/88  03/01/23 120/80     Physical Exam: Vital signs reviewed ZOX:WRUE is a well-developed well-nourished alert cooperative    who appearsr stated age in no acute distress.  HEENT: normocephalic atraumatic , Eyes: PERRL EOM's full, conjunctiva clear, Nares: paten,t no deformity discharge or tenderness., Ears: no deformity EAC's clear TMs with normal landmarks. Mouth: clear OP, no lesions, edema.  Moist mucous membranes. Dentition in adequate repair. NECK: supple without masses, thyromegaly or bruits. CHEST/PULM:  Clear to auscultation and percussion breath sounds equal no wheeze , rales or rhonchi. No chest wall deformities or tenderness. Breast: normal by inspection . No dimpling, discharge, masses, tenderness or discharge . CV: PMI is nondisplaced, S1 S2 no gallops, murmurs, rubs. Peripheral pulses are full without delay.No JVD .  ABDOMEN: Bowel sounds normal nontender  No guard or rebound, no hepato splenomegal no CVA tenderness.  Extremtities:  No clubbing cyanosis or edema, no acute joint swelling or redness no focal atrophy NEURO:  Oriented x3, cranial nerves 3-12 appear to be intact, no obvious focal weakness,gait within normal limits no abnormal reflexes or asymmetrical SKIN:  No acute rashes normal turgor, color, no bruising or petechiae. PSYCH: Oriented, good eye contact, no obvious depression anxiety, cognition and judgment appear normal. LN: no cervical axillary adenopathy Diabetic Foot Exam - Simple   Simple Foot Form Visual Inspection No deformities, no ulcerations, no other skin breakdown bilaterally: Yes Sensation Testing Intact to touch and monofilament testing bilaterally: Yes Pulse Check Posterior Tibialis and Dorsalis pulse intact bilaterally: Yes Comments     Lab Results  Component Value Date   WBC 6.5 02/23/2024   HGB 14.0 02/23/2024   HCT 41.3 02/23/2024   PLT 258.0 02/23/2024   GLUCOSE 140 (H) 02/23/2024   CHOL 192 02/23/2024   TRIG 331.0 (H) 02/23/2024   HDL 63.80 02/23/2024   LDLDIRECT 119.0 10/13/2022   LDLCALC 62 02/23/2024   ALT 16 02/23/2024   AST 14 02/23/2024   NA 138 02/23/2024   K 4.2 02/23/2024   CL 101 02/23/2024   CREATININE 0.76 02/23/2024   BUN 15 02/23/2024   CO2 27 02/23/2024   TSH 2.19 02/23/2024   INR 1.03 09/12/2011   HGBA1C 6.6 (H) 02/23/2024   MICROALBUR 36.6 (H) 02/23/2024    BP Readings from Last 3 Encounters:  03/07/24 100/66  10/06/23 130/88  03/01/23 120/80    Lab results reviewed with patient   ASSESSMENT AND PLAN:  Discussed the following assessment and plan:    ICD-10-CM   1. Visit for preventive health examination  Z00.00     2. Essential hypertension  I10     3. Diabetes mellitus with complication in adult patient (HCC)  E11.8     4. 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 Medication management  Z79.899     6. Family history of renal failure  Z84.1  7. Hyperlipidemia, unspecified hyperlipidemia type  E78.5     8. Snoring  R06.83       Ldl at goal A1c is at goal BP  good  may be too low at times?  Reviewed importance of   hydration on meds she is on .  Self dx "dx  gluten intolerance   next day .   Self dx   diarrhea . " Stopped these foods and changed diet and now diarrhea she has had whole life is better " not sure what to make of this but  ok to continue and perhaps get lab celiac screen in future  Urine protein  up again   on acei on sglt2  but had stopped  Tld her to caution with advil since effect on renal and cv  Disc plan many med manipulations and importance of hydration and the plan fu in  3-4 mos or as needed  Consider echo  if pounding heart sx persist do not sound like arrhythmia   See pat instructions as to plan  of med change  Return in about 3 months (around 06/07/2024).  Patient Care Team: Marquise Lambson, Neta Mends, MD as PCP - General (Internal Medicine) Patient Instructions  So some of your symptoms could be from a dehydration state.  But farxiga is best for kidney  function help as well as diabetes .  Bp may be  low at times causing a symptoms.  Go back on farxiga 5 mg per day Can decrease janumet to 1 per day. Lisinopril to 20 mg per day Stop the hydrochlorothiazide  for now.   Stay hydrated   We can rewrite prescriptions if we decide to remain on the above doses.   Limit  advil for reasons discussed.   If sleep  interruption iscontinuing to be a problem consider seeing sleep specialist to check for sleep apnea that is common with diabetes.  If palpitations on going  or progressive we can order an echocardiogram  but  agree probably related to medication and hydration status.  Stay on pravastatin as the LDL is at goal .   Plan fu in 3 months or as needed ro readress all of the above     Assaria K. Jameson Tormey M.D.

## 2024-03-09 ENCOUNTER — Telehealth: Payer: Self-pay

## 2024-03-09 ENCOUNTER — Other Ambulatory Visit: Payer: Self-pay | Admitting: Internal Medicine

## 2024-03-09 DIAGNOSIS — I10 Essential (primary) hypertension: Secondary | ICD-10-CM

## 2024-03-09 MED ORDER — LISINOPRIL 20 MG PO TABS: 20.0000 mg | ORAL_TABLET | Freq: Every day | ORAL | 0 refills | Status: AC

## 2024-03-09 NOTE — Telephone Encounter (Signed)
 Copied from CRM [id number]. Topic: Clinical - Prescription Issue >> Mar 09, 2024  9:55 AM Isabell A wrote: Reason for CRM: Patient states lisinopril (ZESTRIL) 30 MG tablet should've been refilled to 20mg . Requesting to send to [facility] on file.

## 2024-03-09 NOTE — Telephone Encounter (Signed)
 Copied from CRM [id number]. Topic: Clinical - Medication Refill >> Mar 09, 2024  9:54 AM Isabell A wrote: Most Recent Primary Care Visit:  Provider: [facility]  Department: [facility]  Visit Type: LAB  Date: 02/23/2024  Medication: pravastatin (PRAVACHOL) 20 MG tablet  Has the patient contacted their pharmacy? No (Agent: If no, request that the patient contact the pharmacy for the refill. If patient does not wish to contact the pharmacy document the reason why and proceed with request.) (Agent: If yes, when and what did the pharmacy advise?)  Is this the correct pharmacy for this prescription? Yes If no, delete pharmacy and type the correct one.  This is the patient's preferred pharmacy:  [facility] # [address] [place] - [address] [place] [address] Phone: [phone] Fax: [phone]  Has the prescription been filled recently? Yes  Is the patient out of the medication? No, 2 pills left.   Has the patient been seen for an appointment in the last year OR does the patient have an upcoming appointment? Yes  Can we respond through MyChart? Yes  Agent: Please be advised that Rx refills may take up to 3 business days. We ask that you follow-up with your pharmacy.

## 2024-03-09 NOTE — Addendum Note (Signed)
 Addended by: Vickii Chafe on: 03/09/2024 12:33 PM   Modules accepted: Orders

## 2024-03-09 NOTE — Telephone Encounter (Signed)
 Per noted from 03/07/2024:  Bp may be  low at times causing a symptoms.  Go back on farxiga 5 mg per day Can decrease janumet to 1 per day. Lisinopril to 20 mg per day Stop the hydrochlorothiazide  for now.    Spoke to pt. Pt reports she is not sure why Janumet cut down to 1 per day. Pt states she would like to continue same as before twice a day.   Rx Lisinopril 20mg  sent to pharmacy.   Please advise on the Janumet.

## 2024-03-09 NOTE — Telephone Encounter (Signed)
 Ok to stay on 2 per day janumet

## 2024-03-27 ENCOUNTER — Telehealth: Payer: Self-pay

## 2024-03-27 NOTE — Telephone Encounter (Signed)
-----   Message from Berniece Andreas sent at 03/07/2024  7:34 PM EDT ----- Please make sure she gets   the patient instructions and fu appt in 3 months  She left before I could print out the plan although she wrote it down . Also med list will be incorrect. And not sure how to fix it at this time thanks

## 2024-03-27 NOTE — Telephone Encounter (Signed)
 Spoke to pt.   Pt states she went back to lisinopril 30mg  instead of 20 mg as 20 mg did not help with BP. She states 30mg  help bringer her diastolic to 80. She is taking [place] 5mg  and Janumet 2 tabs a daily.   3 month follow up is schedule for 06/12/2024.

## 2024-04-12 ENCOUNTER — Other Ambulatory Visit: Payer: Self-pay | Admitting: Internal Medicine

## 2024-05-29 ENCOUNTER — Other Ambulatory Visit: Payer: Self-pay | Admitting: Internal Medicine

## 2024-06-01 ENCOUNTER — Other Ambulatory Visit: Payer: Self-pay | Admitting: Internal Medicine

## 2024-06-01 NOTE — Telephone Encounter (Signed)
 Copied from CRM [id number]. Topic: Clinical - Medication Refill >> Jun 01, 2024  1:50 PM Leah C wrote: Medication: pravastatin  (PRAVACHOL ) 20 MG tablet  Has the patient contacted their pharmacy? Yes- Patient stated that pharmacy stated that they didn't receive the refill.   This is the patient's preferred pharmacy:  [facility] # [address], [place] - [address] [place] [address] Phone: [phone] Fax: [phone]   Is this the correct pharmacy for this prescription? Yes If no, delete pharmacy and type the correct one.   Has the prescription been filled recently? Yes  Is the patient out of the medication? Yes  Has the patient been seen for an appointment in the last year OR does the patient have an upcoming appointment? Yes  Can we respond through MyChart? Yes  Agent: Please be advised that Rx refills may take up to 3 business days. We ask that you follow-up with your pharmacy.

## 2024-06-05 NOTE — Telephone Encounter (Signed)
 Attempted to reach pt. Left a detail message that a prescription was sent in last week, if have any problem to give a call back.

## 2024-06-12 ENCOUNTER — Ambulatory Visit: Admitting: Internal Medicine

## 2024-06-12 ENCOUNTER — Encounter: Payer: Self-pay | Admitting: Internal Medicine

## 2024-06-12 VITALS — BP 100/76 | HR 64 | Temp 98.2°F | Wt 139.4 lb

## 2024-06-12 DIAGNOSIS — E785 Hyperlipidemia, unspecified: Secondary | ICD-10-CM

## 2024-06-12 DIAGNOSIS — E118 Type 2 diabetes mellitus with unspecified complications: Secondary | ICD-10-CM

## 2024-06-12 DIAGNOSIS — Z79899 Other long term (current) drug therapy: Secondary | ICD-10-CM

## 2024-06-12 DIAGNOSIS — Z7984 Long term (current) use of oral hypoglycemic drugs: Secondary | ICD-10-CM

## 2024-06-12 DIAGNOSIS — I10 Essential (primary) hypertension: Secondary | ICD-10-CM | POA: Diagnosis not present

## 2024-06-12 DIAGNOSIS — E1121 Type 2 diabetes mellitus with diabetic nephropathy: Secondary | ICD-10-CM

## 2024-06-12 LAB — POCT GLYCOSYLATED HEMOGLOBIN (HGB A1C): Hemoglobin A1C: 7.3 % — AB (ref 4.0–5.6)

## 2024-06-12 NOTE — Progress Notes (Signed)
 Chief Complaint  Patient presents with   Medical Management of Chronic Issues    3 months f/u. Pt would like to discuss med and blood work.     HPI: Amber [name] 58 y.o. come in for Chronic disease management  fu multiple med problems and meds> Since last visit : BP  Is great  can run and play tennis  Feels better on current  meds  Has stopped hctz and off farxiga    has some 5 mg now  ROS: See pertinent positives and negatives per HPI. Says better on gluten free  thinks allergic to fish from [employer] and sine elimination better  se is ?getting facial swelling and redness  no resp sx   Past Medical History:  Diagnosis Date   Anemia    Diabetes mellitus    Hyperlipidemia    Hypertension     Family History  Problem Relation Age of Onset   Hypertension Mother    CAD Mother    Diabetes Father    CAD Father    Healthy Brother    Stroke Maternal Grandmother    Hypertension Maternal Grandmother    Diabetes Paternal Grandmother    Kidney failure Paternal Grandmother    Developmental delay Daughter    Diabetes Daughter    Diabetes Other        Grandparent   Stroke Other        Grandparent   Hyperlipidemia Other        parents    Social History   Socioeconomic History   Marital status: Married    Spouse name: Not on file   Number of children: 1   Years of education: BS   Highest education level: Bachelor's degree (e.g., BA, AB, BS)  Occupational History   Occupation: Acupuncturist  Tobacco Use   Smoking status: Never   Smokeless tobacco: Never  Vaping Use   Vaping status: Never Used  Substance and Sexual Activity   Alcohol use: No   Drug use: No   Sexual activity: Yes    Birth control/protection: Surgical  Other Topics Concern   Not on file  Social History Narrative   Married    BS degree   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    G1P1 child has developmental disability  Poss genetically based eval at [facility]   hh of 3  34 hours per week    Neg etsFA   [place]  last eye exam .    Social Drivers of Health   Financial Resource Strain: Low Risk  (10/06/2023)   Overall Financial Resource Strain (CARDIA)    Difficulty of Paying Living Expenses: Not very hard  Food Insecurity: No Food Insecurity (10/06/2023)   Hunger Vital Sign    Worried About Running Out of Food in the Last Year: Never true    Ran Out of Food in the Last Year: Never true  Transportation Needs: No Transportation Needs (10/06/2023)   PRAPARE - Administrator, Civil Service (Medical): No    Lack of Transportation (Non-Medical): No  Physical Activity: Sufficiently Active (10/06/2023)   Exercise Vital Sign    Days of Exercise per Week: 4 days    Minutes of Exercise per Session: 120 min  Stress: No Stress Concern Present (10/06/2023)   [employer] of Occupational Health - Occupational Stress Questionnaire    Feeling of Stress : Not at all  Social Connections: Unknown (10/06/2023)   Social Connection and Isolation Panel    Frequency  of Communication with Friends and Family: Patient declined    Frequency of Social Gatherings with Friends and Family: Patient declined    Attends Religious Services: Never    Database administrator or Organizations: No    Attends Engineer, structural: Not on file    Marital Status: Married    Outpatient Medications Prior to Visit  Medication Sig Dispense Refill   amLODipine  (NORVASC ) 5 MG tablet Take 1 tablet (5 mg total) by mouth daily. Increase dose 90 tablet 1   Coenzyme Q10 (CO Q 10 PO) Take 1 tablet by mouth daily.     lisinopril  (ZESTRIL ) 20 MG tablet Take 1 tablet (20 mg total) by mouth daily. 90 tablet 0   pravastatin  (PRAVACHOL ) 20 MG tablet TAKE ONE TABLET BY MOUTH ONCE A DAY 90 tablet 0   SitaGLIPtin -MetFORMIN  HCl (JANUMET  XR) 50-1000 MG TB24 Take 2 tablets by mouth daily. Or as directed 180 tablet 1   vitamin B-12 (CYANOCOBALAMIN ) 100 MCG tablet Take 100 mcg by mouth daily.     dapagliflozin  propanediol  (FARXIGA ) 10 MG TABS tablet take 1 tablet by mouth daily before breakfast 90 tablet 2   dapagliflozin  propanediol (FARXIGA ) 5 MG TABS tablet Take 1 tablet (5 mg total) by mouth daily. Dosage change (Patient not taking: Reported on 06/12/2024) 90 tablet 3   hydrochlorothiazide  (HYDRODIURIL ) 25 MG tablet Take 25 mg by mouth daily. (Patient not taking: Reported on 06/12/2024)     No facility-administered medications prior to visit.     EXAM:  BP 100/76   Pulse 64   Temp 98.2 F (36.8 C)   Wt 139 lb 6.4 oz (63.2 kg)   SpO2 97%   BMI 24.89 kg/m   Body mass index is 24.89 kg/m.  GENERAL: vitals reviewed and listed above, alert, oriented, appears well hydrated and in no acute distress HEENT: atraumatic, conjunctiva  clear, no obvious abnormalities on inspection of external nose and ears  NECK: no obvious masses on inspection palpation  LUNGS: clear to auscultation bilaterally, no wheezes, rales or rhonchi, good air movement CV: HRRR, no clubbing cyanosis or  peripheral edema nl cap refill  MS: moves all extremities without noticeable focal  abnormality PSYCH: pleasant and cooperative, no obvious depression or anxiety Lab Results  Component Value Date   WBC 6.5 02/23/2024   HGB 14.0 02/23/2024   HCT 41.3 02/23/2024   PLT 258.0 02/23/2024   GLUCOSE 140 (H) 02/23/2024   CHOL 192 02/23/2024   TRIG 331.0 (H) 02/23/2024   HDL 63.80 02/23/2024   LDLDIRECT 119.0 10/13/2022   LDLCALC 62 02/23/2024   ALT 16 02/23/2024   AST 14 02/23/2024   NA 138 02/23/2024   K 4.2 02/23/2024   CL 101 02/23/2024   CREATININE 0.76 02/23/2024   BUN 15 02/23/2024   CO2 27 02/23/2024   TSH 2.19 02/23/2024   INR 1.03 09/12/2011   HGBA1C 7.3 (A) 06/12/2024   MICROALBUR 36.6 (H) 02/23/2024   BP Readings from Last 3 Encounters:  06/12/24 100/76  03/07/24 100/66  10/06/23 130/88    ASSESSMENT AND PLAN:  Discussed the following assessment and plan:  Controlled type 2 diabetes mellitus with  proteinuric diabetic nephropathy (HCC) - Plan: POC HgB A1c  Essential hypertension  Diabetes mellitus with complication in adult patient [facility])  Medication management  Hyperlipidemia, unspecified hyperlipidemia type Lab at next visit  bmp ? If celiac screen as she feels better on gluten free.  A1c  urine microalbumin ,other as indicated  Get back on farxiga  5 and avoid dehydration .  Or hold  as needed.  Check bp and bg if feels  bg is up or a problem  , disc cgm but cost not paid by insurance unless on insulin usually.  Not sure if the stataglipin is helping that much but she feel is doing well  so not changing to day  Ok to take only 20 mg or lisinopril  since bp  controlled .  Ldl is at goal so ok to remain on 20 pravastatin  for now.  -Patient advised to return or notify health care team  if  new concerns arise.  Patient Instructions  Glad you are doing better . Ok to restart the farxiga  5 mg per day    Avoid dehydration.  On these medication.   A1c I 7.3 today .   Shaniqwa Horsman K. Christy Ehrsam M.D.

## 2024-06-12 NOTE — Patient Instructions (Signed)
 Glad you are doing better . Ok to restart the farxiga  5 mg per day    Avoid dehydration.  On these medication.   A1c I 7.3 today .

## 2024-08-18 ENCOUNTER — Other Ambulatory Visit: Payer: Self-pay | Admitting: Internal Medicine

## 2024-11-15 ENCOUNTER — Other Ambulatory Visit: Payer: Self-pay | Admitting: Internal Medicine

## 2024-12-08 ENCOUNTER — Other Ambulatory Visit: Payer: Self-pay | Admitting: Internal Medicine
